# Patient Record
Sex: Female | Born: 1975 | Race: Black or African American | Hispanic: No | Smoking: Former smoker
Health system: Southern US, Community
[De-identification: ages and names within clinical notes are randomized; demographics above are authoritative.]

## PROBLEM LIST (undated history)

## (undated) DIAGNOSIS — G5750 Tarsal tunnel syndrome, unspecified lower limb: Secondary | ICD-10-CM

## (undated) DIAGNOSIS — I10 Essential (primary) hypertension: Secondary | ICD-10-CM

## (undated) DIAGNOSIS — K8000 Calculus of gallbladder with acute cholecystitis without obstruction: Secondary | ICD-10-CM

## (undated) DIAGNOSIS — G5752 Tarsal tunnel syndrome, left lower limb: Secondary | ICD-10-CM

## (undated) DIAGNOSIS — Z720 Tobacco use: Secondary | ICD-10-CM

## (undated) DIAGNOSIS — G43109 Migraine with aura, not intractable, without status migrainosus: Secondary | ICD-10-CM

## (undated) HISTORY — DX: Migraine with aura, not intractable, without status migrainosus: G43.109

## (undated) HISTORY — PX: TUBAL LIGATION: SHX77

## (undated) HISTORY — PX: FOOT SURGERY: SHX648

## (undated) HISTORY — DX: Tarsal tunnel syndrome, left lower limb: G57.52

## (undated) HISTORY — DX: Tobacco use: Z72.0

## (undated) HISTORY — DX: Calculus of gallbladder with acute cholecystitis without obstruction: K80.00

---

## 2006-03-16 ENCOUNTER — Emergency Department: Payer: Self-pay | Admitting: Unknown Physician Specialty

## 2006-05-12 ENCOUNTER — Ambulatory Visit: Payer: Self-pay | Admitting: Podiatry

## 2006-06-05 ENCOUNTER — Emergency Department: Payer: Self-pay | Admitting: Emergency Medicine

## 2006-06-06 ENCOUNTER — Emergency Department: Payer: Self-pay | Admitting: Emergency Medicine

## 2006-06-14 ENCOUNTER — Encounter: Payer: Self-pay | Admitting: Unknown Physician Specialty

## 2006-12-30 ENCOUNTER — Emergency Department: Payer: Self-pay | Admitting: Emergency Medicine

## 2007-10-21 ENCOUNTER — Emergency Department: Payer: Self-pay | Admitting: Emergency Medicine

## 2007-11-03 ENCOUNTER — Emergency Department: Payer: Self-pay | Admitting: Emergency Medicine

## 2008-03-12 ENCOUNTER — Emergency Department: Payer: Self-pay | Admitting: Internal Medicine

## 2008-03-13 ENCOUNTER — Emergency Department: Payer: Self-pay | Admitting: Emergency Medicine

## 2008-05-11 ENCOUNTER — Ambulatory Visit: Payer: Self-pay | Admitting: Family Medicine

## 2008-08-24 ENCOUNTER — Emergency Department: Payer: Self-pay | Admitting: Emergency Medicine

## 2008-08-27 ENCOUNTER — Emergency Department: Payer: Self-pay | Admitting: Emergency Medicine

## 2008-09-21 ENCOUNTER — Emergency Department: Payer: Self-pay | Admitting: Emergency Medicine

## 2009-05-16 ENCOUNTER — Emergency Department: Payer: Self-pay | Admitting: Emergency Medicine

## 2010-02-11 ENCOUNTER — Emergency Department: Payer: Self-pay | Admitting: Emergency Medicine

## 2011-03-07 ENCOUNTER — Emergency Department: Payer: Self-pay | Admitting: Emergency Medicine

## 2011-03-07 LAB — URINALYSIS, COMPLETE
Bilirubin,UR: NEGATIVE
Leukocyte Esterase: NEGATIVE
Nitrite: NEGATIVE
Ph: 5 (ref 4.5–8.0)
Protein: NEGATIVE
RBC,UR: 5 /HPF (ref 0–5)

## 2012-08-30 ENCOUNTER — Emergency Department: Payer: Self-pay | Admitting: Emergency Medicine

## 2012-12-21 ENCOUNTER — Emergency Department: Payer: Self-pay | Admitting: Emergency Medicine

## 2013-01-04 ENCOUNTER — Ambulatory Visit: Payer: Self-pay | Admitting: Family Medicine

## 2013-08-12 ENCOUNTER — Emergency Department: Payer: Self-pay | Admitting: Emergency Medicine

## 2013-08-12 LAB — CBC WITH DIFFERENTIAL/PLATELET
BASOS ABS: 0 10*3/uL (ref 0.0–0.1)
Basophil %: 0.8 %
EOS PCT: 2.1 %
Eosinophil #: 0.1 10*3/uL (ref 0.0–0.7)
HCT: 39.3 % (ref 35.0–47.0)
HGB: 12.6 g/dL (ref 12.0–16.0)
LYMPHS ABS: 2.4 10*3/uL (ref 1.0–3.6)
LYMPHS PCT: 44.7 %
MCH: 27.6 pg (ref 26.0–34.0)
MCHC: 32.1 g/dL (ref 32.0–36.0)
MCV: 86 fL (ref 80–100)
Monocyte #: 0.5 x10 3/mm (ref 0.2–0.9)
Monocyte %: 8.4 %
NEUTROS PCT: 44 %
Neutrophil #: 2.4 10*3/uL (ref 1.4–6.5)
PLATELETS: 219 10*3/uL (ref 150–440)
RBC: 4.56 10*6/uL (ref 3.80–5.20)
RDW: 14.9 % — ABNORMAL HIGH (ref 11.5–14.5)
WBC: 5.4 10*3/uL (ref 3.6–11.0)

## 2013-08-12 LAB — COMPREHENSIVE METABOLIC PANEL
ALK PHOS: 61 U/L
ANION GAP: 6 — AB (ref 7–16)
Albumin: 3.5 g/dL (ref 3.4–5.0)
BUN: 12 mg/dL (ref 7–18)
Bilirubin,Total: 0.2 mg/dL (ref 0.2–1.0)
CALCIUM: 8.1 mg/dL — AB (ref 8.5–10.1)
CREATININE: 0.96 mg/dL (ref 0.60–1.30)
Chloride: 110 mmol/L — ABNORMAL HIGH (ref 98–107)
Co2: 25 mmol/L (ref 21–32)
GLUCOSE: 115 mg/dL — AB (ref 65–99)
Osmolality: 282 (ref 275–301)
Potassium: 3.1 mmol/L — ABNORMAL LOW (ref 3.5–5.1)
SGOT(AST): 12 U/L — ABNORMAL LOW (ref 15–37)
SGPT (ALT): 16 U/L (ref 12–78)
Sodium: 141 mmol/L (ref 136–145)
TOTAL PROTEIN: 6.7 g/dL (ref 6.4–8.2)

## 2013-08-12 LAB — URINALYSIS, COMPLETE
BLOOD: NEGATIVE
Bilirubin,UR: NEGATIVE
Glucose,UR: NEGATIVE mg/dL (ref 0–75)
Ketone: NEGATIVE
Leukocyte Esterase: NEGATIVE
Nitrite: NEGATIVE
PH: 5 (ref 4.5–8.0)
Protein: NEGATIVE
RBC,UR: 1 /HPF (ref 0–5)
Specific Gravity: 1.03 (ref 1.003–1.030)
Squamous Epithelial: 2
WBC UR: 1 /HPF (ref 0–5)

## 2013-09-09 ENCOUNTER — Emergency Department: Payer: Self-pay | Admitting: Emergency Medicine

## 2013-12-05 ENCOUNTER — Ambulatory Visit: Payer: Medicaid Other | Admitting: Podiatry

## 2014-07-02 ENCOUNTER — Encounter: Payer: Self-pay | Admitting: Emergency Medicine

## 2014-07-02 ENCOUNTER — Emergency Department
Admission: EM | Admit: 2014-07-02 | Discharge: 2014-07-02 | Disposition: A | Discharge status: Home / Self Care | Payer: Medicaid Other | Attending: Emergency Medicine | Admitting: Emergency Medicine

## 2014-07-02 DIAGNOSIS — J029 Acute pharyngitis, unspecified: Secondary | ICD-10-CM | POA: Insufficient documentation

## 2014-07-02 DIAGNOSIS — I10 Essential (primary) hypertension: Secondary | ICD-10-CM | POA: Diagnosis not present

## 2014-07-02 DIAGNOSIS — Z720 Tobacco use: Secondary | ICD-10-CM | POA: Insufficient documentation

## 2014-07-02 HISTORY — DX: Tarsal tunnel syndrome, unspecified lower limb: G57.50

## 2014-07-02 HISTORY — DX: Essential (primary) hypertension: I10

## 2014-07-02 MED ORDER — HYDROCODONE-ACETAMINOPHEN 5-325 MG PO TABS
ORAL_TABLET | ORAL | Status: AC
  Administered 2014-07-02: 1 via ORAL
  Filled 2014-07-02: qty 1

## 2014-07-02 MED ORDER — LIDOCAINE VISCOUS 2 % MT SOLN
OROMUCOSAL | Status: AC
  Administered 2014-07-02: 10 mL via OROMUCOSAL
  Filled 2014-07-02: qty 15

## 2014-07-02 MED ORDER — DEXAMETHASONE SODIUM PHOSPHATE 10 MG/ML IJ SOLN
10.0000 mg | Freq: Once | INTRAMUSCULAR | Status: AC
  Administered 2014-07-02: 10 mg via INTRAMUSCULAR

## 2014-07-02 MED ORDER — LORATADINE 10 MG PO TABS: 10.0000 mg | ORAL_TABLET | Freq: Every day | ORAL | Status: DC

## 2014-07-02 MED ORDER — DEXAMETHASONE SODIUM PHOSPHATE 10 MG/ML IJ SOLN
INTRAMUSCULAR | Status: AC
  Administered 2014-07-02: 10 mg via INTRAMUSCULAR
  Filled 2014-07-02: qty 1

## 2014-07-02 MED ORDER — HYDROCODONE-ACETAMINOPHEN 5-325 MG PO TABS: 1.0000 | ORAL_TABLET | Freq: Four times a day (QID) | ORAL | Status: DC | PRN

## 2014-07-02 MED ORDER — PENICILLIN G BENZATHINE 1200000 UNIT/2ML IM SUSP
1.2000 10*6.[IU] | Freq: Once | INTRAMUSCULAR | Status: AC
  Administered 2014-07-02: 1.2 10*6.[IU] via INTRAMUSCULAR
  Filled 2014-07-02: qty 2

## 2014-07-02 MED ORDER — LIDOCAINE VISCOUS 2 % MT SOLN
10.0000 mL | Freq: Once | OROMUCOSAL | Status: AC
  Administered 2014-07-02: 10 mL via OROMUCOSAL

## 2014-07-02 MED ORDER — HYDROCODONE-ACETAMINOPHEN 5-325 MG PO TABS
1.0000 | ORAL_TABLET | Freq: Once | ORAL | Status: AC
  Administered 2014-07-02: 1 via ORAL

## 2014-07-02 NOTE — ED Notes (Signed)
Patient discharge and follow up information reviewed with patient by ED nursing staff and patient given the opportunity to ask questions pertaining to ED visit and discharge plan of care. Patient advised that should symptoms not continue to improve, resolve entirely, or should new symptoms develop then a follow up visit with their PCP or a return visit to the ED may be warranted. Patient verbalized consent and understanding of discharge plan of care including potential need for further evaluation. Patient being discharged in stable condition per attending ED PA on duty.  

## 2014-07-02 NOTE — ED Provider Notes (Signed)
CSN: [id number]     Arrival date & time 07/02/14  1808 History   First MD Initiated Contact with Patient 07/02/14 1844     Chief Complaint  Patient presents with  . Sore Throat     (Consider location/radiation/quality/duration/timing/severity/associated sxs/prior Treatment) HPI  Patient with 2 day history worsening sore throat burning subjective fever chills rates pain as approximately an 8 out of 10 says it's hard to swallow she is controlling her secretions she is not complaining of any wheezing stridor nothing seems to make this better at this time and is here today for further evaluation notes no other symptoms of note  Past Medical History  Diagnosis Date  . Hypertension   . Tarsal tunnel syndrome    Past Surgical History  Procedure Laterality Date  . Foot surgery Left    No family history on file. History  Substance Use Topics  . Smoking status: Current Every Day Smoker -- 1.00 packs/day    Types: Cigarettes  . Smokeless tobacco: Not on file  . Alcohol Use: No   OB History    No data available     Review of Systems  Constitutional: Negative.   HENT: Positive for sore throat. Negative for congestion, ear discharge and ear pain.   Eyes: Negative.   Respiratory: Negative.  Negative for stridor.   Cardiovascular: Negative.   Skin: Negative.   Neurological: Negative for headaches.  All other systems reviewed and are negative.  Review of Systems  Constitutional: Negative.   HENT: Positive for sore throat. Negative for congestion, ear discharge and ear pain.   Eyes: Negative.   Respiratory: Negative.  Negative for stridor.   Cardiovascular: Negative.   Skin: Negative.   Neurological: Negative for headaches.  All other systems reviewed and are negative.    Allergies  Ibuprofen  Home Medications   Prior to Admission medications   Medication Sig Start Date End Date Taking? Authorizing Provider  HYDROcodone-acetaminophen (NORCO) 5-325 MG per tablet Take 1  tablet by mouth every 6 (six) hours as needed for moderate pain or severe pain. 07/02/14   Malvina Schadler William C Naela Nodal, PA-C  loratadine (CLARITIN) 10 MG tablet Take 1 tablet (10 mg total) by mouth daily. 07/02/14 07/02/15  Eleyna Brugh William C Aymara Sassi, PA-C   BP 148/95 mmHg  Pulse 86  Temp(Src) 98.7 F (37.1 C)  Resp 18  Ht 5\' 5"  (1.651 m)  Wt 187 lb (84.823 kg)  BMI 31.12 kg/m2  SpO2 98%  LMP 06/07/2014 (Exact Date) Physical Exam African-American female appearing stated age well-developed well-nourished and in no acute distress her vitals were reviewed per the nursing note Head ears eyes nose next throat exam reveals +1 tonsils with exudate anterior cervical adenopathy airway is open and intact Cardiovascular regular rate and rhythm no murmurs rubs gallops pulmonary lungs are clear to auscultation bilaterally skin appears free of rash or disease Neuro exam is nonfocal cranial nerves II through XII grossly intact Psych patient is very pleasant affect and appropriate ED Course  Procedures (including critical care time)   MDM  The patient were +1 tonsils exudate anterior cervical adenopathy who is having worsening sore throat over 2 days pain with swallowing was given a shot of Decadron some viscous lidocaine hydrocodone for pain and she opted for a shot of Bicillin LA here she is to follow-up with ENT if symptoms persist return here for any acute concerns or worsening symptoms she was: Controlling her secretions and no difficulty breathing or stridor Final diagnoses:  Pharyngitis        Deryck Hippler Rosalyn GessWilliam C Mela Perham, PA-C 07/02/14 2005  Sharyn CreamerMark Quale, MD 07/14/14 [phone]

## 2014-07-02 NOTE — ED Notes (Signed)
Patient presents to ED with c/o sore throat x2 days; reports pain worse today and throat feels more swollen.

## 2015-01-02 ENCOUNTER — Encounter: Payer: Self-pay | Admitting: Urgent Care

## 2015-01-02 DIAGNOSIS — I10 Essential (primary) hypertension: Secondary | ICD-10-CM | POA: Insufficient documentation

## 2015-01-02 DIAGNOSIS — F17210 Nicotine dependence, cigarettes, uncomplicated: Secondary | ICD-10-CM | POA: Insufficient documentation

## 2015-01-02 DIAGNOSIS — R51 Headache: Secondary | ICD-10-CM | POA: Insufficient documentation

## 2015-01-02 DIAGNOSIS — Z79899 Other long term (current) drug therapy: Secondary | ICD-10-CM | POA: Insufficient documentation

## 2015-01-02 DIAGNOSIS — Z791 Long term (current) use of non-steroidal anti-inflammatories (NSAID): Secondary | ICD-10-CM | POA: Insufficient documentation

## 2015-01-02 MED ORDER — SODIUM CHLORIDE 0.9 % IV BOLUS (SEPSIS)
1000.0000 mL | Freq: Once | INTRAVENOUS | Status: AC
  Administered 2015-01-02: 1000 mL via INTRAVENOUS

## 2015-01-02 MED ORDER — HYDROCHLOROTHIAZIDE 25 MG PO TABS
25.0000 mg | ORAL_TABLET | Freq: Every day | ORAL | Status: DC
  Administered 2015-01-02: 25 mg via ORAL
  Filled 2015-01-02: qty 1

## 2015-01-02 MED ORDER — PROCHLORPERAZINE EDISYLATE 5 MG/ML IJ SOLN
10.0000 mg | Freq: Four times a day (QID) | INTRAMUSCULAR | Status: DC | PRN
  Administered 2015-01-02: 10 mg via INTRAVENOUS
  Filled 2015-01-02: qty 2

## 2015-01-02 MED ORDER — PROPRANOLOL HCL 80 MG PO TABS
80.0000 mg | ORAL_TABLET | Freq: Once | ORAL | Status: AC
  Administered 2015-01-03: 80 mg via ORAL
  Filled 2015-01-02 (×2): qty 1

## 2015-01-02 NOTE — ED Notes (Signed)
Pharmacy called for Inderal 80mg  dose; spoke with pharmacy tech who advised that medication would be sent to ED. Asked float nurse and charge nurse to follow up on medication and administer when it arrives.

## 2015-01-02 NOTE — ED Notes (Signed)
Spoke with Derrill KayGoodman, MD regarding presenting c/o and triage assessment. MD with [facility] for: PIV, 1L NS, Compazine 10mg  IVP, HCTZ 25mg  PO, and Propranolol 80mg  PO. Orders to be entered and carried by this RN.

## 2015-01-02 NOTE — ED Notes (Signed)
Patient presents with c/o a generalized headache. Patient reports that she has a headache "20 days out of the month" due to HTN. Patient has been out of BP meds x 2 months. Denies visual changes and neck pain.

## 2015-01-03 ENCOUNTER — Emergency Department
Admission: EM | Admit: 2015-01-03 | Discharge: 2015-01-03 | Disposition: A | Discharge status: Home / Self Care | Payer: Medicaid Other | Attending: Emergency Medicine | Admitting: Emergency Medicine

## 2015-01-03 DIAGNOSIS — I10 Essential (primary) hypertension: Secondary | ICD-10-CM

## 2015-01-03 MED ORDER — BISOPROLOL-HYDROCHLOROTHIAZIDE 10-6.25 MG PO TABS: 1.0000 | ORAL_TABLET | Freq: Every day | ORAL | Status: DC

## 2015-01-03 MED ORDER — AMITRIPTYLINE HCL 25 MG PO TABS
25.0000 mg | ORAL_TABLET | Freq: Every day | ORAL | Status: DC
  Administered 2015-01-03: 25 mg via ORAL
  Filled 2015-01-03 (×2): qty 1

## 2015-01-03 MED ORDER — AMITRIPTYLINE HCL 50 MG PO TABS: 50.0000 mg | ORAL_TABLET | Freq: Every day | ORAL | Status: DC

## 2015-01-03 NOTE — ED Provider Notes (Signed)
[facility] Emergency Department Provider Note  ____________________________________________  Time seen: 1:15 AM  I have reviewed the triage vital signs and the nursing notes.   HISTORY  Chief Complaint Hypertension and Headache     HPI Brandi Swanson is a 39 y.o. female presents with history of migraines and hypertension. Patient states tonight she started experiencing a migraine headache consistent with previous migraines. In addition patient stated that she ran out of her hypertensive medication approximately 20 days ago and noted that she was markedly hypertensive today. Patient also states that she has ran out of her amitriptyline for migraine prophylaxis as well. This is secondary to loss of medical insurance.Patient denied any weakness numbness or gait instability noted visual changes. Patient states that her headache is much improved since receiving IV fluids and HCTZ on arrival. Current pain score 5 out of 10     Past Medical History  Diagnosis Date  . Hypertension   . Tarsal tunnel syndrome     There are no active problems to display for this patient.   Past Surgical History  Procedure Laterality Date  . Foot surgery Left     Current Outpatient Rx  Name  Route  Sig  Dispense  Refill  . amitriptyline (ELAVIL) 50 MG tablet   Oral   Take 50 mg by mouth at bedtime.         . diclofenac sodium (VOLTAREN) 1 % GEL   Topical   Apply 1 application topically 2 (two) times daily.         . hydrochlorothiazide (HYDRODIURIL) 25 MG tablet   Oral   Take 25 mg by mouth daily.         Marland Kitchen. HYDROcodone-acetaminophen (NORCO) 5-325 MG per tablet   Oral   Take 1 tablet by mouth every 6 (six) hours as needed for moderate pain or severe pain.   10 tablet   0   . loratadine (CLARITIN) 10 MG tablet   Oral   Take 1 tablet (10 mg total) by mouth daily.   30 tablet   2   . metoCLOPramide (REGLAN) 10 MG tablet   Oral   Take 10 mg by mouth  every 6 (six) hours as needed for nausea.         Marland Kitchen. omeprazole (PRILOSEC) 40 MG capsule   Oral   Take 40 mg by mouth daily.         . pregabalin (LYRICA) 100 MG capsule   Oral   Take 100 mg by mouth 3 (three) times daily.         . propranolol (INDERAL) 80 MG tablet   Oral   Take 80 mg by mouth 2 (two) times daily.         . SUMAtriptan (IMITREX) 50 MG tablet   Oral   Take 50 mg by mouth every 2 (two) hours as needed for migraine. May repeat in 2 hours if headache persists or recurs.           Allergies Ibuprofen  No family history on file.  Social History Social History  Substance Use Topics  . Smoking status: Current Every Day Smoker -- 1.00 packs/day    Types: Cigarettes  . Smokeless tobacco: None  . Alcohol Use: No    Review of Systems  Constitutional: Negative for fever. Eyes: Negative for visual changes. ENT: Negative for sore throat. Cardiovascular: Negative for chest pain. Respiratory: Negative for shortness of breath. Gastrointestinal: Negative for abdominal pain, vomiting  and diarrhea. Genitourinary: Negative for dysuria. Musculoskeletal: Negative for back pain. Skin: Negative for rash. Neurological: Positive for headaches.   10-point ROS otherwise negative.  ____________________________________________   PHYSICAL EXAM:  VITAL SIGNS: ED Triage Vitals  Enc Vitals Group     BP 01/02/15 2011 197/117 mmHg     Pulse Rate 01/02/15 2011 80     Resp 01/02/15 2011 16     Temp 01/02/15 2011 97.9 F (36.6 C)     Temp Source 01/02/15 2011 Oral     SpO2 01/02/15 2011 100 %     Weight 01/02/15 2011 190 lb (86.183 kg)     Height 01/02/15 2011  (1.651 m)     Head Cir --      Peak Flow --      Pain Score 01/02/15 2011 10     Pain Loc --      Pain Edu? --      Excl. in GC? --      Constitutional: Alert and oriented. Well appearing and in no distress. Eyes: Conjunctivae are normal. PERRL. Normal extraocular movements. ENT   Head:  Normocephalic and atraumatic.   Nose: No congestion/rhinnorhea.   Mouth/Throat: Mucous membranes are moist.   Neck: No stridor. Hematological/Lymphatic/Immunilogical: No cervical lymphadenopathy. Cardiovascular: Normal rate, regular rhythm. Normal and symmetric distal pulses are present in all extremities. No murmurs, rubs, or gallops. Respiratory: Normal respiratory effort without tachypnea nor retractions. Breath sounds are clear and equal bilaterally. No wheezes/rales/rhonchi. Gastrointestinal: Soft and nontender. No distention. There is no CVA tenderness. Genitourinary: deferred Musculoskeletal: Nontender with normal range of motion in all extremities. No joint effusions.  No lower extremity tenderness nor edema. Neurologic:  Normal speech and language. No gross focal neurologic deficits are appreciated. Speech is normal.  Skin:  Skin is warm, dry and intact. No rash noted. Psychiatric: Mood and affect are normal. Speech and behavior are normal. Patient exhibits appropriate insight and judgment.  ____________________________________________      INITIAL IMPRESSION / ASSESSMENT AND PLAN / ED COURSE  Pertinent labs & imaging results that were available during my care of the patient were reviewed by me and considered in my medical decision making (see chart for details).  Patient received HCTZ, Propanolol and Amitriptyline be prescribed the same for home ____________________________________________   FINAL CLINICAL IMPRESSION(S) / ED DIAGNOSES  Final diagnoses:  Essential hypertension      Darci Current, MD 01/03/15 [phone]

## 2015-01-03 NOTE — Discharge Instructions (Signed)
Hypertension Hypertension, commonly called high blood pressure, is when the force of blood pumping through your arteries is too strong. Your arteries are the blood vessels that carry blood from your heart throughout your body. A blood pressure reading consists of a higher number over a lower number, such as 110/72. The higher number (systolic) is the pressure inside your arteries when your heart pumps. The lower number (diastolic) is the pressure inside your arteries when your heart relaxes. Ideally you want your blood pressure below 120/80. Hypertension forces your heart to work harder to pump blood. Your arteries may become narrow or stiff. Having untreated or uncontrolled hypertension can cause heart attack, stroke, kidney disease, and other problems. RISK FACTORS Some risk factors for high blood pressure are controllable. Others are not.  Risk factors you cannot control include:   Race. You may be at higher risk if you are African American.  Age. Risk increases with age.  Gender. Men are at higher risk than women before age 45 years. After age 65, women are at higher risk than men. Risk factors you can control include:  Not getting enough exercise or physical activity.  Being overweight.  Getting too much fat, sugar, calories, or salt in your diet.  Drinking too much alcohol. SIGNS AND SYMPTOMS Hypertension does not usually cause signs or symptoms. Extremely high blood pressure (hypertensive crisis) may cause headache, anxiety, shortness of breath, and nosebleed. DIAGNOSIS To check if you have hypertension, your health care provider will measure your blood pressure while you are seated, with your arm held at the level of your heart. It should be measured at least twice using the same arm. Certain conditions can cause a difference in blood pressure between your right and left arms. A blood pressure reading that is higher than normal on one occasion does not mean that you need treatment. If  it is not clear whether you have high blood pressure, you may be asked to return on a different day to have your blood pressure checked again. Or, you may be asked to monitor your blood pressure at home for 1 or more weeks. TREATMENT Treating high blood pressure includes making lifestyle changes and possibly taking medicine. Living a healthy lifestyle can help lower high blood pressure. You may need to change some of your habits. Lifestyle changes may include:  Following the DASH diet. This diet is high in fruits, vegetables, and whole grains. It is low in salt, red meat, and added sugars.  Keep your sodium intake below 2,300 mg per day.  Getting at least 30-45 minutes of aerobic exercise at least 4 times per week.  Losing weight if necessary.  Not smoking.  Limiting alcoholic beverages.  Learning ways to reduce stress. Your health care provider may prescribe medicine if lifestyle changes are not enough to get your blood pressure under control, and if one of the following is true:  You are 18-59 years of age and your systolic blood pressure is above 140.  You are 60 years of age or older, and your systolic blood pressure is above 150.  Your diastolic blood pressure is above 90.  You have diabetes, and your systolic blood pressure is over 140 or your diastolic blood pressure is over 90.  You have kidney disease and your blood pressure is above 140/90.  You have heart disease and your blood pressure is above 140/90. Your personal target blood pressure may vary depending on your medical conditions, your age, and other factors. HOME CARE INSTRUCTIONS    Have your blood pressure rechecked as directed by your health care provider.   Take medicines only as directed by your health care provider. Follow the directions carefully. Blood pressure medicines must be taken as prescribed. The medicine does not work as well when you skip doses. Skipping doses also puts you at risk for  problems.  Do not smoke.   Monitor your blood pressure at home as directed by your health care provider. SEEK MEDICAL CARE IF:   You think you are having a reaction to medicines taken.  You have recurrent headaches or feel dizzy.  You have swelling in your ankles.  You have trouble with your vision. SEEK IMMEDIATE MEDICAL CARE IF:  You develop a severe headache or confusion.  You have unusual weakness, numbness, or feel faint.  You have severe chest or abdominal pain.  You vomit repeatedly.  You have trouble breathing. MAKE SURE YOU:   Understand these instructions.  Will watch your condition.  Will get help right away if you are not doing well or get worse.   This information is not intended to replace advice given to you by your health care provider. Make sure you discuss any questions you have with your health care provider.   Document Released: 02/02/2005 Document Revised: 06/19/2014 Document Reviewed: 11/25/2012 Elsevier Interactive Patient Education 2016 [employer]  

## 2015-06-24 ENCOUNTER — Emergency Department: Payer: Self-pay

## 2015-06-24 ENCOUNTER — Encounter: Payer: Self-pay | Admitting: Emergency Medicine

## 2015-06-24 ENCOUNTER — Emergency Department
Admission: EM | Admit: 2015-06-24 | Discharge: 2015-06-24 | Disposition: A | Discharge status: Home / Self Care | Payer: Self-pay | Attending: Emergency Medicine | Admitting: Emergency Medicine

## 2015-06-24 DIAGNOSIS — F17210 Nicotine dependence, cigarettes, uncomplicated: Secondary | ICD-10-CM | POA: Insufficient documentation

## 2015-06-24 DIAGNOSIS — Z79899 Other long term (current) drug therapy: Secondary | ICD-10-CM | POA: Insufficient documentation

## 2015-06-24 DIAGNOSIS — I10 Essential (primary) hypertension: Secondary | ICD-10-CM | POA: Insufficient documentation

## 2015-06-24 DIAGNOSIS — J9801 Acute bronchospasm: Secondary | ICD-10-CM | POA: Insufficient documentation

## 2015-06-24 LAB — BASIC METABOLIC PANEL
ANION GAP: 8 (ref 5–15)
BUN: 9 mg/dL (ref 6–20)
CO2: 25 mmol/L (ref 22–32)
Calcium: 8.9 mg/dL (ref 8.9–10.3)
Chloride: 110 mmol/L (ref 101–111)
Creatinine, Ser: 0.85 mg/dL (ref 0.44–1.00)
GFR calc Af Amer: >60 mL/min (ref >60)
GLUCOSE: 91 mg/dL (ref 65–99)
POTASSIUM: 3.5 mmol/L (ref 3.5–5.1)
Sodium: 143 mmol/L (ref 135–145)

## 2015-06-24 LAB — URINALYSIS COMPLETE WITH MICROSCOPIC (ARMC ONLY)
Bilirubin Urine: NEGATIVE
Glucose, UA: NEGATIVE mg/dL
Hgb urine dipstick: NEGATIVE
KETONES UR: NEGATIVE mg/dL
Leukocytes, UA: NEGATIVE
Nitrite: NEGATIVE
PROTEIN: NEGATIVE mg/dL
RBC / HPF: NONE SEEN RBC/hpf (ref 0–5)
Specific Gravity, Urine: 1.025 (ref 1.005–1.030)
WBC, UA: NONE SEEN WBC/hpf (ref 0–5)
pH: 6 (ref 5.0–8.0)

## 2015-06-24 LAB — CBC
HCT: 40.2 % (ref 35.0–47.0)
Hemoglobin: 13.3 g/dL (ref 12.0–16.0)
MCH: 27.4 pg (ref 26.0–34.0)
MCHC: 33 g/dL (ref 32.0–36.0)
MCV: 83 fL (ref 80.0–100.0)
PLATELETS: 212 10*3/uL (ref 150–440)
RBC: 4.84 MIL/uL (ref 3.80–5.20)
RDW: 16.3 % — ABNORMAL HIGH (ref 11.5–14.5)
WBC: 5.8 10*3/uL (ref 3.6–11.0)

## 2015-06-24 MED ORDER — IOPAMIDOL (ISOVUE-370) INJECTION 76%
75.0000 mL | Freq: Once | INTRAVENOUS | Status: AC | PRN
  Administered 2015-06-24: 75 mL via INTRAVENOUS

## 2015-06-24 MED ORDER — BISOPROLOL-HYDROCHLOROTHIAZIDE 10-6.25 MG PO TABS
1.0000 | ORAL_TABLET | Freq: Every day | ORAL | Status: DC
  Administered 2015-06-24: 1 via ORAL
  Filled 2015-06-24: qty 1

## 2015-06-24 MED ORDER — PREDNISONE 20 MG PO TABS
50.0000 mg | ORAL_TABLET | Freq: Once | ORAL | Status: AC
  Administered 2015-06-24: 50 mg via ORAL
  Filled 2015-06-24: qty 2

## 2015-06-24 MED ORDER — BISOPROLOL-HYDROCHLOROTHIAZIDE 10-6.25 MG PO TABS: 1.0000 | ORAL_TABLET | Freq: Every day | ORAL | Status: DC

## 2015-06-24 MED ORDER — PREDNISONE 10 MG PO TABS
ORAL_TABLET | ORAL | Status: AC
Start: 2015-06-24 — End: 2015-06-24
  Administered 2015-06-24: 10 mg
  Filled 2015-06-24: qty 1

## 2015-06-24 MED ORDER — ACETAMINOPHEN 325 MG PO TABS
ORAL_TABLET | ORAL | Status: AC
  Administered 2015-06-24: 650 mg via ORAL
  Filled 2015-06-24: qty 2

## 2015-06-24 MED ORDER — IPRATROPIUM-ALBUTEROL 0.5-2.5 (3) MG/3ML IN SOLN
3.0000 mL | Freq: Once | RESPIRATORY_TRACT | Status: AC
  Administered 2015-06-24: 3 mL via RESPIRATORY_TRACT
  Filled 2015-06-24: qty 3

## 2015-06-24 MED ORDER — ALBUTEROL SULFATE HFA 108 (90 BASE) MCG/ACT IN AERS: 2.0000 | INHALATION_SPRAY | Freq: Four times a day (QID) | RESPIRATORY_TRACT | Status: DC | PRN

## 2015-06-24 MED ORDER — PREDNISONE 10 MG PO TABS: 50.0000 mg | ORAL_TABLET | Freq: Every day | ORAL | Status: DC

## 2015-06-24 MED ORDER — ACETAMINOPHEN 325 MG PO TABS
650.0000 mg | ORAL_TABLET | Freq: Once | ORAL | Status: AC
  Administered 2015-06-24: 650 mg via ORAL

## 2015-06-24 MED ORDER — IBUPROFEN 600 MG PO TABS
ORAL_TABLET | ORAL | Status: AC
  Filled 2015-06-24: qty 1

## 2015-06-24 NOTE — Care Management Note (Signed)
Case Management Note  Patient Details  Name: Brandi Swanson MRN: [id number] Date of Birth: September 25, 1975  Subjective/Objective:     Patient  provided with application packet for Medication Management.  She has stated she requires help with her regular medications.             Action/Plan:   Expected Discharge Date:                  Expected Discharge Plan:     In-House Referral:     Discharge planning Services     Post Acute Care Choice:    Choice offered to:     DME Arranged:    DME Agency:     HH Arranged:    HH Agency:     Status of Service:     Medicare Important Message Given:    Date Medicare IM Given:    Medicare IM give by:    Date Additional Medicare IM Given:    Additional Medicare Important Message give by:     If discussed at Long Length of Stay Meetings, dates discussed:    Additional Comments:  Brandi BueCheryl Aissata Wilmore, RN 06/24/2015, 1:41 PM

## 2015-06-24 NOTE — ED Provider Notes (Signed)
[facility] Emergency Department Provider Note   ____________________________________________  Time seen: Approximately 11:20 AM I have reviewed the triage vital signs and the triage nursing note.  HISTORY  Chief Complaint Cough; Shortness of Breath; and Hypertension   Historian Patient  HPI Brandi Swanson is a 40 y.o. female with history smoking is here with shortness of breath that she knows this morning she's also had some coughing this morning which consisted of clear sputum with a small amount of blood in it. This never happened before. She does not take a blood thinner. She has been out of her blood pressure medications. No fever. No pleuritic chest pain. No chest pain otherwise.  Partial family history, father may have had a blood clot although it sounds like they may be talking about coronary artery disease.  No additional risk factors for DVT. Does complain of mild lower extremity edema at times bilaterally. She complains of occasional calf tenderness bilaterally.  No prior history of wheezing or inhaler use.    Past Medical History  Diagnosis Date  . Hypertension   . Tarsal tunnel syndrome     There are no active problems to display for this patient.   Past Surgical History  Procedure Laterality Date  . Foot surgery Left     Current Outpatient Rx  Name  Route  Sig  Dispense  Refill  . albuterol (PROVENTIL HFA;VENTOLIN HFA) 108 (90 Base) MCG/ACT inhaler   Inhalation   Inhale 2 puffs into the lungs every 6 (six) hours as needed for wheezing or shortness of breath.   1 Inhaler   0   . amitriptyline (ELAVIL) 50 MG tablet   Oral   Take 50 mg by mouth at bedtime.         Marland Kitchen. amitriptyline (ELAVIL) 50 MG tablet   Oral   Take 1 tablet (50 mg total) by mouth at bedtime.   90 tablet   0   . bisoprolol-hydrochlorothiazide (ZIAC) 10-6.25 MG tablet   Oral   Take 1 tablet by mouth daily.   30 tablet   0   . diclofenac sodium  (VOLTAREN) 1 % GEL   Topical   Apply 1 application topically 2 (two) times daily.         . hydrochlorothiazide (HYDRODIURIL) 25 MG tablet   Oral   Take 25 mg by mouth daily.         Marland Kitchen. HYDROcodone-acetaminophen (NORCO) 5-325 MG per tablet   Oral   Take 1 tablet by mouth every 6 (six) hours as needed for moderate pain or severe pain.   10 tablet   0   . loratadine (CLARITIN) 10 MG tablet   Oral   Take 1 tablet (10 mg total) by mouth daily.   30 tablet   2   . metoCLOPramide (REGLAN) 10 MG tablet   Oral   Take 10 mg by mouth every 6 (six) hours as needed for nausea.         Marland Kitchen. omeprazole (PRILOSEC) 40 MG capsule   Oral   Take 40 mg by mouth daily.         . predniSONE (DELTASONE) 10 MG tablet   Oral   Take 5 tablets (50 mg total) by mouth daily.   20 tablet   0   . pregabalin (LYRICA) 100 MG capsule   Oral   Take 100 mg by mouth 3 (three) times daily.         . propranolol (INDERAL)  80 MG tablet   Oral   Take 80 mg by mouth 2 (two) times daily.         . SUMAtriptan (IMITREX) 50 MG tablet   Oral   Take 50 mg by mouth every 2 (two) hours as needed for migraine. May repeat in 2 hours if headache persists or recurs.           Allergies Ibuprofen  No family history on file.  Social History Social History  Substance Use Topics  . Smoking status: Current Every Day Smoker -- 1.00 packs/day    Types: Cigarettes  . Smokeless tobacco: None  . Alcohol Use: No    Review of Systems  Constitutional: Negative for fever. Eyes: Negative for visual changes. ENT: Negative for sore throat. Cardiovascular: Negative for chest pain. Respiratory: Positive for shortness of breath. Gastrointestinal: Negative for abdominal pain, vomiting and diarrhea. Genitourinary: Negative for dysuria. Musculoskeletal: Negative for back pain. Skin: Negative for rash. Neurological: Negative for headache. 10 point Review of Systems otherwise  negative ____________________________________________   PHYSICAL EXAM:  VITAL SIGNS: ED Triage Vitals  Enc Vitals Group     BP 06/24/15 0951 179/98 mmHg     Pulse Rate 06/24/15 0951 74     Resp 06/24/15 0951 18     Temp 06/24/15 0951 98.4 F (36.9 C)     Temp Source 06/24/15 0951 Oral     SpO2 06/24/15 0951 100 %     Weight 06/24/15 0951 198 lb (89.812 kg)     Height 06/24/15 0951  (1.651 m)     Head Cir --      Peak Flow --      Pain Score 06/24/15 0952 7     Pain Loc --      Pain Edu? --      Excl. in GC? --      Constitutional: Alert and oriented. Well appearing and in no distress. HEENT   Head: Normocephalic and atraumatic.      Eyes: Conjunctivae are normal. PERRL. Normal extraocular movements.      Ears:         Nose: No congestion/rhinnorhea.   Mouth/Throat: Mucous membranes are moist.   Neck: No stridor. Cardiovascular/Chest: Normal rate, regular rhythm.  No murmurs, rubs, or gallops. Respiratory: Normal respiratory effort without tachypnea nor retractions. Moderate end expiratory wheezing in all fields. No rhonchi. No rales. Gastrointestinal: Soft. No distention, no guarding, no rebound. Nontender.    Genitourinary/rectal:Deferred Musculoskeletal: Nontender with normal range of motion in all extremities. No joint effusions.  No lower extremity tenderness. Trace edema bilateral lower extremities. Neurologic:  Normal speech and language. No gross or focal neurologic deficits are appreciated. Skin:  Skin is warm, dry and intact. No rash noted. Psychiatric: Mood and affect are normal. Speech and behavior are normal. Patient exhibits appropriate insight and judgment.  ____________________________________________   EKG I, Governor Rooks, MD, the attending physician have personally viewed and interpreted all ECGs.  77 bpm. Normal sinus rhythm. Narrow QRS. Normal axis. Normal ST and T-wave ____________________________________________  LABS (pertinent  positives/negatives)  White blood count 5.8, hemoglobin 13.3 and platelet count 212 Basic metabolic panel without significant abnormality Urinalysis without significant abnormalities  ____________________________________________  RADIOLOGY All Xrays were viewed by me. Imaging interpreted by Radiologist.  Chest two-view: Negative chest __________________________________________  PROCEDURES  Procedure(s) performed: None  Critical Care performed: None  ____________________________________________   ED COURSE / ASSESSMENT AND PLAN  Pertinent labs & imaging results that were available  during my care of the patient were reviewed by me and considered in my medical decision making (see chart for details).   Patient states she is mainly here because of coughing up blood, it sounds like a relatively small amount overall, but she is complaining of shortness of breath which is unusual for her.  Her O2 sat is 100% on room air. She's not complaining of pleuritic chest pain, however given the hemoptysis and shortness of breath complaint, I do think that it is clinically indicated to rule out pulmonary embolism with CT of the chest. I discussed risks versus benefit and in shared decision making we chose to proceed.  On clinical exam she does have a moderate wheezing, and does not report a history of asthma. She is a smoker, and I will treat her with DuoNeb treatment.  In terms or hypertension, I'm going to give her the medication that she has been on previously both here in our department and as prescription.   Patient's CT scan showed no PE. I am going to treat her for bronchitis and bronchospasm with prednisone and albuterol. Patient is breathing much better after breathing treatment.    CONSULTATIONS:   None   Patient / Family / Caregiver informed of clinical course, medical decision-making process, and agree with plan.   I discussed return precautions, follow-up instructions, and  discharged instructions with patient and/or family.   ___________________________________________   FINAL CLINICAL IMPRESSION(S) / ED DIAGNOSES   Final diagnoses:  Essential hypertension  Bronchospasm, acute              Note: This dictation was prepared with Dragon dictation. Any transcriptional errors that result from this process are unintentional   Governor Rooks, MD 06/24/15 [phone]

## 2015-06-24 NOTE — ED Notes (Signed)
Pt here with c/o coughing up blood, shob that began this am, states her sputum is clear with bloody streaks. Pt appears in no distress, states she has been out of her bp medication also. 179/98 in triage.

## 2015-06-24 NOTE — Discharge Instructions (Signed)
You were evaluated shortness of breath and coughing up small amount of blood, and your exam and evaluation are reassuring today in the emergency room. Your found to have wheezing and the pain diagnosed with bronchitis. Your being prescribed your home blood pressure medication, as well as a new inhaler for wheezing called albuterol.  Return to the emergency department for any worsening condition including new or worsening trouble breathing, shortness of breath, fever, confusion or altered mental status, passing out, or any other symptoms concerning to you.   Bronchospasm, Adult A bronchospasm is a spasm or tightening of the airways going into the lungs. During a bronchospasm breathing becomes more difficult because the airways get smaller. When this happens there can be coughing, a whistling sound when breathing (wheezing), and difficulty breathing. Bronchospasm is often associated with asthma, but not all patients who experience a bronchospasm have asthma. CAUSES  A bronchospasm is caused by inflammation or irritation of the airways. The inflammation or irritation may be triggered by:   Allergies (such as to animals, pollen, food, or mold). Allergens that cause bronchospasm may cause wheezing immediately after exposure or many hours later.   Infection. Viral infections are believed to be the most common cause of bronchospasm.   Exercise.   Irritants (such as pollution, cigarette smoke, strong odors, aerosol sprays, and paint fumes).   Weather changes. Winds increase molds and pollens in the air. Rain refreshes the air by washing irritants out. Cold air may cause inflammation.   Stress and emotional upset.  SIGNS AND SYMPTOMS   Wheezing.   Excessive nighttime coughing.   Frequent or severe coughing with a simple cold.   Chest tightness.   Shortness of breath.  DIAGNOSIS  Bronchospasm is usually diagnosed through a history and physical exam. Tests, such as chest X-rays, are  sometimes done to look for other conditions. TREATMENT   Inhaled medicines can be given to open up your airways and help you breathe. The medicines can be given using either an inhaler or a nebulizer machine.  Corticosteroid medicines may be given for severe bronchospasm, usually when it is associated with asthma. HOME CARE INSTRUCTIONS   Always have a plan prepared for seeking medical care. Know when to call your health care provider and local emergency services (911 in the [place] Know where you can access local emergency care.  Only take medicines as directed by your health care provider.  If you were prescribed an inhaler or nebulizer machine, ask your health care provider to explain how to use it correctly. Always use a spacer with your inhaler if you were given one.  It is necessary to remain calm during an attack. Try to relax and breathe more slowly.  Control your home environment in the following ways:   Change your heating and air conditioning filter at least once a month.   Limit your use of fireplaces and wood stoves.  Do not smoke and do not allow smoking in your home.   Avoid exposure to perfumes and fragrances.   Get rid of pests (such as roaches and mice) and their droppings.   Throw away plants if you see mold on them.   Keep your house clean and dust free.   Replace carpet with wood, tile, or vinyl flooring. Carpet can trap dander and dust.   Use allergy-proof pillows, mattress covers, and box spring covers.   Wash bed sheets and blankets every week in hot water and dry them in a dryer.   Use blankets  that are made of polyester or cotton.   Wash hands frequently. SEEK MEDICAL CARE IF:   You have muscle aches.   You have chest pain.   The sputum changes from clear or white to yellow, green, gray, or bloody.   The sputum you cough up gets thicker.   There are problems that may be related to the medicine you are given, such as a rash,  itching, swelling, or trouble breathing.  SEEK IMMEDIATE MEDICAL CARE IF:   You have worsening wheezing and coughing even after taking your prescribed medicines.   You have increased difficulty breathing.   You develop severe chest pain. MAKE SURE YOU:   Understand these instructions.  Will watch your condition.  Will get help right away if you are not doing well or get worse.   This information is not intended to replace advice given to you by your health care provider. Make sure you discuss any questions you have with your health care provider.   Document Released: 02/05/2003 Document Revised: 02/23/2014 Document Reviewed: 07/25/2012 Elsevier Interactive Patient Education 2016 Elsevier Inc. Acute Bronchitis Bronchitis is inflammation of the airways that extend from the windpipe into the lungs (bronchi). The inflammation often causes mucus to develop. This leads to a cough, which is the most common symptom of bronchitis.  In acute bronchitis, the condition usually develops suddenly and goes away over time, usually in a couple weeks. Smoking, allergies, and asthma can make bronchitis worse. Repeated episodes of bronchitis may cause further lung problems.  CAUSES Acute bronchitis is most often caused by the same virus that causes a cold. The virus can spread from person to person (contagious) through coughing, sneezing, and touching contaminated objects. SIGNS AND SYMPTOMS   Cough.   Fever.   Coughing up mucus.   Body aches.   Chest congestion.   Chills.   Shortness of breath.   Sore throat.  DIAGNOSIS  Acute bronchitis is usually diagnosed through a physical exam. Your health care provider will also ask you questions about your medical history. Tests, such as chest X-rays, are sometimes done to rule out other conditions.  TREATMENT  Acute bronchitis usually goes away in a couple weeks. Oftentimes, no medical treatment is necessary. Medicines are sometimes  given for relief of fever or cough. Antibiotic medicines are usually not needed but may be prescribed in certain situations. In some cases, an inhaler may be recommended to help reduce shortness of breath and control the cough. A cool mist vaporizer may also be used to help thin bronchial secretions and make it easier to clear the chest.  HOME CARE INSTRUCTIONS  Get plenty of rest.   Drink enough fluids to keep your urine clear or pale yellow (unless you have a medical condition that requires fluid restriction). Increasing fluids may help thin your respiratory secretions (sputum) and reduce chest congestion, and it will prevent dehydration.   Take medicines only as directed by your health care provider.  If you were prescribed an antibiotic medicine, finish it all even if you start to feel better.  Avoid smoking and secondhand smoke. Exposure to cigarette smoke or irritating chemicals will make bronchitis worse. If you are a smoker, consider using nicotine gum or skin patches to help control withdrawal symptoms. Quitting smoking will help your lungs heal faster.   Reduce the chances of another bout of acute bronchitis by washing your hands frequently, avoiding people with cold symptoms, and trying not to touch your hands to your mouth, nose,  or eyes.   Keep all follow-up visits as directed by your health care provider.  SEEK MEDICAL CARE IF: Your symptoms do not improve after 1 week of treatment.  SEEK IMMEDIATE MEDICAL CARE IF:  You develop an increased fever or chills.   You have chest pain.   You have severe shortness of breath.  You have bloody sputum.   You develop dehydration.  You faint or repeatedly feel like you are going to pass out.  You develop repeated vomiting.  You develop a severe headache. MAKE SURE YOU:   Understand these instructions.  Will watch your condition.  Will get help right away if you are not doing well or get worse.   This information  is not intended to replace advice given to you by your health care provider. Make sure you discuss any questions you have with your health care provider.   Document Released: 03/12/2004 Document Revised: 02/23/2014 Document Reviewed: 07/26/2012 Elsevier Interactive Patient Education [employer]2016 Elsevier Inc.

## 2015-06-24 NOTE — ED Notes (Signed)
Pt to CT

## 2015-06-24 NOTE — ED Notes (Signed)
MD at bedside. 

## 2015-07-04 ENCOUNTER — Ambulatory Visit: Payer: Self-pay | Admitting: Urology

## 2015-07-04 VITALS — BP 136/89 | HR 84 | Resp 16 | Ht 65.0 in | Wt 201.0 lb

## 2015-07-04 DIAGNOSIS — K219 Gastro-esophageal reflux disease without esophagitis: Secondary | ICD-10-CM | POA: Insufficient documentation

## 2015-07-04 DIAGNOSIS — F32A Depression, unspecified: Secondary | ICD-10-CM

## 2015-07-04 DIAGNOSIS — J449 Chronic obstructive pulmonary disease, unspecified: Secondary | ICD-10-CM

## 2015-07-04 DIAGNOSIS — J302 Other seasonal allergic rhinitis: Secondary | ICD-10-CM

## 2015-07-04 DIAGNOSIS — F329 Major depressive disorder, single episode, unspecified: Secondary | ICD-10-CM | POA: Insufficient documentation

## 2015-07-04 DIAGNOSIS — F419 Anxiety disorder, unspecified: Secondary | ICD-10-CM | POA: Insufficient documentation

## 2015-07-04 DIAGNOSIS — I10 Essential (primary) hypertension: Secondary | ICD-10-CM | POA: Insufficient documentation

## 2015-07-04 MED ORDER — AMITRIPTYLINE HCL 50 MG PO TABS: 50.0000 mg | ORAL_TABLET | Freq: Every day | ORAL | Status: DC

## 2015-07-04 MED ORDER — LORATADINE 10 MG PO TABS: 10.0000 mg | ORAL_TABLET | Freq: Every day | ORAL | Status: DC

## 2015-07-04 MED ORDER — PROPRANOLOL HCL 40 MG PO TABS: ORAL_TABLET | ORAL | Status: DC

## 2015-07-04 MED ORDER — OMEPRAZOLE 40 MG PO CPDR: 40.0000 mg | DELAYED_RELEASE_CAPSULE | Freq: Every day | ORAL | Status: DC

## 2015-07-04 MED ORDER — ALBUTEROL SULFATE HFA 108 (90 BASE) MCG/ACT IN AERS: 2.0000 | INHALATION_SPRAY | Freq: Four times a day (QID) | RESPIRATORY_TRACT | Status: DC | PRN

## 2015-07-04 MED ORDER — PROMETHAZINE HCL 25 MG PO TABS: 25.0000 mg | ORAL_TABLET | Freq: Four times a day (QID) | ORAL | Status: DC | PRN

## 2015-07-04 MED ORDER — HYDROCHLOROTHIAZIDE 25 MG PO TABS: 25.0000 mg | ORAL_TABLET | Freq: Every day | ORAL | Status: DC

## 2015-07-04 MED ORDER — GABAPENTIN 300 MG PO CAPS: 300.0000 mg | ORAL_CAPSULE | Freq: Three times a day (TID) | ORAL | Status: DC

## 2015-07-04 MED ORDER — BUDESONIDE-FORMOTEROL FUMARATE 160-4.5 MCG/ACT IN AERO: 2.0000 | INHALATION_SPRAY | Freq: Two times a day (BID) | RESPIRATORY_TRACT | Status: DC

## 2015-07-04 NOTE — Progress Notes (Signed)
  Patient: Brandi Swanson Female    DOB: 1976/01/02   40 y.o.   MRN: [id number] Visit Date: 07/04/2015  Today's Provider: [facility]   Chief Complaint  Patient presents with  . Establish Care  . Hypertension  . COPD  . Gastroesophageal Reflux   Subjective:    HPI   Patient hear to establish care.  Recently seen in the ED for exacerbation of her COPD.    Allergies  Allergen Reactions  . Ibuprofen Swelling   Previous Medications   BISOPROLOL-HYDROCHLOROTHIAZIDE (ZIAC) 10-6.25 MG TABLET    Take 1 tablet by mouth daily.   DICLOFENAC SODIUM (VOLTAREN) 1 % GEL    Apply 1 application topically 2 (two) times daily.   HYDROCODONE-ACETAMINOPHEN (NORCO) 5-325 MG PER TABLET    Take 1 tablet by mouth every 6 (six) hours as needed for moderate pain or severe pain.   METOCLOPRAMIDE (REGLAN) 10 MG TABLET    Take 10 mg by mouth every 6 (six) hours as needed for nausea.   PAROXETINE (PAXIL) 20 MG TABLET    Take 20 mg by mouth daily.   PREDNISONE (DELTASONE) 10 MG TABLET    Take 5 tablets (50 mg total) by mouth daily.   PREGABALIN (LYRICA) 100 MG CAPSULE    Take 100 mg by mouth 3 (three) times daily.   SUMATRIPTAN (IMITREX) 50 MG TABLET    Take 50 mg by mouth every 2 (two) hours as needed for migraine. May repeat in 2 hours if headache persists or recurs.    Review of Systems  Constitutional: Negative.   HENT: Negative.   Eyes: Negative.   Respiratory: Positive for cough and wheezing.   Cardiovascular: Negative.   Gastrointestinal: Negative.   Endocrine: Negative.   Genitourinary: Negative.   Musculoskeletal: Negative.   Allergic/Immunologic: Negative.   Neurological: Negative.   Hematological: Negative.   Psychiatric/Behavioral: Negative.     Social History  Substance Use Topics  . Smoking status: Current Every Day Smoker -- 0.25 packs/day    Types: Cigarettes  . Smokeless tobacco: Never Used  . Alcohol Use: No   Objective:   BP 136/89 mmHg  Pulse 84  Resp 16   Ht 5\' 5"  (1.651 m)  Wt 201 lb (91.173 kg)  BMI 33.45 kg/m2  LMP 06/11/2015 (Exact Date)  Physical Exam  Constitutional: She is oriented to person, place, and time. She appears well-developed and well-nourished.  HENT:  Head: Normocephalic and atraumatic.  Eyes: Conjunctivae and EOM are normal. Pupils are equal, round, and reactive to light.  Cardiovascular: Normal rate and regular rhythm.   Pulmonary/Chest: Effort normal and breath sounds normal.  Abdominal: Soft. Bowel sounds are normal.  Musculoskeletal: Normal range of motion.  Neurological: She is alert and oriented to person, place, and time. She has normal reflexes.  Skin: Skin is warm and dry.  Psychiatric: She has a normal mood and affect. Her behavior is normal. Judgment and thought content normal.        Assessment & Plan:     1. Anxiety  -refer to mental health  2. COPD  -continue albuterol  -add symbocort  -follow up in one month  3. HTN  -good control  -continue meds  -check labs  -follow up in one month  4. Migraines  -see if there is assistance available for Imitrex and Volteran cream         [facility]   [facility]

## 2015-08-01 ENCOUNTER — Other Ambulatory Visit: Payer: Self-pay

## 2015-08-07 ENCOUNTER — Ambulatory Visit: Payer: Self-pay

## 2015-08-07 ENCOUNTER — Encounter: Payer: Self-pay | Admitting: Pharmacist

## 2015-08-08 ENCOUNTER — Ambulatory Visit: Payer: Self-pay | Admitting: Family Medicine

## 2015-08-08 VITALS — BP 127/82 | HR 77 | Ht 65.0 in

## 2015-08-08 DIAGNOSIS — G43109 Migraine with aura, not intractable, without status migrainosus: Secondary | ICD-10-CM

## 2015-08-08 DIAGNOSIS — F32A Depression, unspecified: Secondary | ICD-10-CM

## 2015-08-08 DIAGNOSIS — F419 Anxiety disorder, unspecified: Secondary | ICD-10-CM

## 2015-08-08 DIAGNOSIS — Z720 Tobacco use: Secondary | ICD-10-CM

## 2015-08-08 DIAGNOSIS — F329 Major depressive disorder, single episode, unspecified: Secondary | ICD-10-CM

## 2015-08-08 DIAGNOSIS — M79672 Pain in left foot: Secondary | ICD-10-CM | POA: Insufficient documentation

## 2015-08-08 DIAGNOSIS — G5752 Tarsal tunnel syndrome, left lower limb: Secondary | ICD-10-CM

## 2015-08-08 DIAGNOSIS — L84 Corns and callosities: Secondary | ICD-10-CM

## 2015-08-08 DIAGNOSIS — I10 Essential (primary) hypertension: Secondary | ICD-10-CM

## 2015-08-08 DIAGNOSIS — J449 Chronic obstructive pulmonary disease, unspecified: Secondary | ICD-10-CM

## 2015-08-08 HISTORY — DX: Migraine with aura, not intractable, without status migrainosus: G43.109

## 2015-08-08 HISTORY — DX: Tarsal tunnel syndrome, left lower limb: G57.52

## 2015-08-08 HISTORY — DX: Tobacco use: Z72.0

## 2015-08-08 LAB — GLUCOSE, POCT (MANUAL RESULT ENTRY): POC Glucose: 116 mg/dl — AB (ref 70–99)

## 2015-08-08 MED ORDER — GABAPENTIN 300 MG PO CAPS: 300.0000 mg | ORAL_CAPSULE | Freq: Three times a day (TID) | ORAL | Status: DC

## 2015-08-08 MED ORDER — DICLOFENAC SODIUM 1 % TD GEL: 2.0000 g | Freq: Two times a day (BID) | TRANSDERMAL | Status: DC

## 2015-08-08 MED ORDER — PAROXETINE HCL 20 MG PO TABS: 20.0000 mg | ORAL_TABLET | Freq: Every day | ORAL | Status: DC

## 2015-08-08 NOTE — Patient Instructions (Addendum)
Never use the imitrex if your blood pressure is high (risk of stroke)  Start back on the gabapentin One pill once a day for 3 days, then one pill twice a day for 3 days, then one pill three times a day  We'll have you see the podiatrist ASAP  Start back on the paxil (paroxetine); if you have any dark thoughts, seek help immediately  I do encourage you to quit smoking Call [phone] to sign up for smoking cessation classes You can call 1-800-QUIT-NOW to talk with a smoking cessation coach   12 Ways to Curb Anxiety  ?Anxiety is normal human sensation. It is what helped our ancestors survive the pitfalls of the wilderness. Anxiety is defined as experiencing worry or nervousness about an imminent event or something with an uncertain outcome. It is a feeling experienced by most people at some point in their lives. Anxiety can be triggered by a very personal issue, such as the illness of a loved one, or an event of global proportions, such as a refugee crisis. Some of the symptoms of anxiety are:  Feeling restless.  Having a feeling of impending danger.  Increased heart rate.  Rapid breathing. Sweating.  Shaking.  Weakness or feeling tired.  Difficulty concentrating on anything except the current worry.  Insomnia.  Stomach or bowel problems. What can we do about anxiety we may be feeling? There are many techniques to help manage stress and relax. Here are 12 ways you can reduce your anxiety almost immediately: 1. Turn off the constant feed of information. Take a social media sabbatical. Studies have shown that social media directly contributes to social anxiety.  2. Monitor your television viewing habits. Are you watching shows that are also contributing to your anxiety, such as 24-hour news stations? Try watching something else, or better yet, nothing at all. Instead, listen to music, read an inspirational book or practice a hobby. 3. Eat nutritious meals. Also, don't skip meals and keep  healthful snacks on hand. Hunger and poor diet contributes to feeling anxious. 4. Sleep. Sleeping on a regular schedule for at least seven to eight hours a night will do wonders for your outlook when you are awake. 5. Exercise. Regular exercise will help rid your body of that anxious energy and help you get more restful sleep. 6. Try deep (diaphragmatic) breathing. Inhale slowly through your nose for five seconds and exhale through your mouth. 7. Practice acceptance and gratitude. When anxiety hits, accept that there are things out of your control that shouldn't be of immediate concern.  8. Seek out humor. When anxiety strikes, watch a funny video, read jokes or call a friend who makes you laugh. Laughter is healing for our bodies and releases endorphins that are calming. 9. Stay positive. Take the effort to replace negative thoughts with positive ones. Try to see a stressful situation in a positive light. Try to come up with solutions rather than dwelling on the problem. 10. Figure out what triggers your anxiety. Keep a journal and make note of anxious moments and the events surrounding them. This will help you identify triggers you can avoid or even eliminate. 11. Talk to someone. Let a trusted friend, family member or even trained professional know that you are feeling overwhelmed and anxious. Verbalize what you are feeling and why.  12. Volunteer. If your anxiety is triggered by a crisis on a large scale, become an advocate and work to resolve the problem that is causing you unease. Anxiety is  often unwelcome and can become overwhelming. If not kept in check, it can become a disorder that could require medical treatment. However, if you take the time to care for yourself and avoid the triggers that make you anxious, you will be able to find moments of relaxation and clarity that make your life much more enjoyable.    Smoking Cessation, Tips for Success If you are ready to quit smoking,  congratulations! You have chosen to help yourself be healthier. Cigarettes bring nicotine, tar, carbon monoxide, and other irritants into your body. Your lungs, heart, and blood vessels will be able to work better without these poisons. There are many different ways to quit smoking. Nicotine gum, nicotine patches, a nicotine inhaler, or nicotine nasal spray can help with physical craving. Hypnosis, support groups, and medicines help break the habit of smoking. WHAT THINGS CAN I DO TO MAKE QUITTING EASIER?  Here are some tips to help you quit for good:  Pick a date when you will quit smoking completely. Tell all of your friends and family about your plan to quit on that date.  Do not try to slowly cut down on the number of cigarettes you are smoking. Pick a quit date and quit smoking completely starting on that day.  Throw away all cigarettes.   Clean and remove all ashtrays from your home, work, and car.  On a card, write down your reasons for quitting. Carry the card with you and read it when you get the urge to smoke.  Cleanse your body of nicotine. Drink enough water and fluids to keep your urine clear or pale yellow. Do this after quitting to flush the nicotine from your body.  Learn to predict your moods. Do not let a bad situation be your excuse to have a cigarette. Some situations in your life might tempt you into wanting a cigarette.  Never have "just one" cigarette. It leads to wanting another and another. Remind yourself of your decision to quit.  Change habits associated with smoking. If you smoked while driving or when feeling stressed, try other activities to replace smoking. Stand up when drinking your coffee. Brush your teeth after eating. Sit in a different chair when you read the paper. Avoid alcohol while trying to quit, and try to drink fewer caffeinated beverages. Alcohol and caffeine may urge you to smoke.  Avoid foods and drinks that can trigger a desire to smoke, such as  sugary or spicy foods and alcohol.  Ask people who smoke not to smoke around you.  Have something planned to do right after eating or having a cup of coffee. For example, plan to take a walk or exercise.  Try a relaxation exercise to calm you down and decrease your stress. Remember, you may be tense and nervous for the first 2 weeks after you quit, but this will pass.  Find new activities to keep your hands busy. Play with a pen, coin, or rubber band. Doodle or draw things on paper.  Brush your teeth right after eating. This will help cut down on the craving for the taste of tobacco after meals. You can also try mouthwash.   Use oral substitutes in place of cigarettes. Try using lemon drops, carrots, cinnamon sticks, or chewing gum. Keep them handy so they are available when you have the urge to smoke.  When you have the urge to smoke, try deep breathing.  Designate your home as a nonsmoking area.  If you are a heavy smoker,  ask your health care provider about a prescription for nicotine chewing gum. It can ease your withdrawal from nicotine.  Reward yourself. Set aside the cigarette money you save and buy yourself something nice.  Look for support from others. Join a support group or smoking cessation program. Ask someone at home or at work to help you with your plan to quit smoking.  Always ask yourself, "Do I need this cigarette or is this just a reflex?" Tell yourself, "Today, I choose not to smoke," or "I do not want to smoke." You are reminding yourself of your decision to quit.  Do not replace cigarette smoking with electronic cigarettes (commonly called e-cigarettes). The safety of e-cigarettes is unknown, and some may contain harmful chemicals.  If you relapse, do not give up! Plan ahead and think about what you will do the next time you get the urge to smoke. HOW WILL I FEEL WHEN I QUIT SMOKING? You may have symptoms of withdrawal because your body is used to nicotine (the  addictive substance in cigarettes). You may crave cigarettes, be irritable, feel very hungry, cough often, get headaches, or have difficulty concentrating. The withdrawal symptoms are only temporary. They are strongest when you first quit but will go away within 10-14 days. When withdrawal symptoms occur, stay in control. Think about your reasons for quitting. Remind yourself that these are signs that your body is healing and getting used to being without cigarettes. Remember that withdrawal symptoms are easier to treat than the major diseases that smoking can cause.  Even after the withdrawal is over, expect periodic urges to smoke. However, these cravings are generally short lived and will go away whether you smoke or not. Do not smoke! WHAT RESOURCES ARE AVAILABLE TO HELP ME QUIT SMOKING? Your health care provider can direct you to community resources or hospitals for support, which may include:  Group support.  Education.  Hypnosis.  Therapy.   This information is not intended to replace advice given to you by your health care provider. Make sure you discuss any questions you have with your health care provider.   Document Released: 11/01/2003 Document Revised: 02/23/2014 Document Reviewed: 07/21/2012 Elsevier Interactive Patient Education [employer]

## 2015-08-08 NOTE — Assessment & Plan Note (Signed)
Encouraged her in her efforts to quit smoking; see AVS

## 2015-08-08 NOTE — Assessment & Plan Note (Signed)
No current SI; start back on paroxetine per her request; return for f/u in 4 weeks; if having dark thoughts or hypomania or mania, then seek medical help immediately

## 2015-08-08 NOTE — Assessment & Plan Note (Signed)
Refer to podiatrist ASAP; start back on gabapentin

## 2015-08-08 NOTE — Assessment & Plan Note (Signed)
Refer to podiatrist 

## 2015-08-08 NOTE — Progress Notes (Signed)
BP 127/82 mmHg  Pulse 77  Ht 5\' 5"  (1.651 m)  SpO2 100%   Subjective:    Patient ID: Brandi Swanson, female    DOB: [birth year], 40 y.o.   MRN: [id number]  HPI: Brandi Swanson E Swanson is a [age] y.o. female  Chief Complaint  Patient presents with  . COPD  . Hypertension   She here for left foot problems; first knot on bottom of left foot in 2005; adopted but found family; two toes turning black, permanent; painful; 2-3 months She uses voltaren gel 1%, uses that on left foot, uses BID, from podiatrist; tarsal tunnel syndrome; uses gabapentin; ran out of it for 2-3 months  She went to the hospital for COPD; doing better with extra inhaler  Migraines with aura; triggered by strong smells; avoids light and sound; uses imitrex and phenergan when needed; runs in the fmaily  Relevant past medical, surgical, family and social history reviewed Past Medical History  Diagnosis Date  . Hypertension   . Tarsal tunnel syndrome   . Tarsal tunnel syndrome of left side 08/08/2015  . Tobacco abuse 08/08/2015  . Migraine with aura 08/08/2015   Past Surgical History  Procedure Laterality Date  . Foot surgery Left     X's Three  . Tubal ligation     Family History  Problem Relation Age of Onset  . COPD Mother   . Heart failure Mother   . Hypertension Mother   . Hypertension Father   . Heart disease Father   . Hypertension Sister   . Hypertension Brother    Social History  Substance Use Topics  . Smoking status: Current Every Day Smoker -- 1.00 packs/day for 20 years    Types: Cigarettes  . Smokeless tobacco: Never Used  . Alcohol Use: No  MD notes: smoking just 6 cigs a day; wanting to quit but doesn't want more medicine right now; hopes to be able to do it on her own  Interim medical history since last visit reviewed. Allergies and medications reviewed  Review of Systems Per HPI unless specifically indicated above     Objective:    BP 127/82 mmHg  Pulse 77  Ht 5\' 5"  (1.651  m)  SpO2 100%  Wt Readings from Last 3 Encounters:  07/04/15 201 lb (91.173 kg)  06/24/15 198 lb (89.812 kg)  01/02/15 190 lb (86.183 kg)    Physical Exam  Constitutional: She appears well-developed and well-nourished. No distress.  Cardiovascular: Normal rate.   Pulmonary/Chest: Effort normal.  Musculoskeletal:       Left foot: There is tenderness and deformity (3rd toe somewhat shortened, drawn up; surgical scar over 3rd metatarsal dorsal surface).  Toenails on the 4th and 5th toe are darker; digits themselves are not violaceous or black or necrotic  Psychiatric: She has a normal mood and affect. Her behavior is normal. Her mood appears not anxious. Her affect is not blunt. Her speech is not rapid and/or pressured. She is not agitated, not aggressive, not hyperactive, not slowed and not withdrawn. Cognition and memory are not impaired. She does not express impulsivity or inappropriate judgment. She does not exhibit a depressed mood. She expresses no homicidal ideation.  Good eye contact with examiner, very pleasant She is attentive.    Results for orders placed or performed in visit on 08/08/15  POCT Glucose (CBG)  Result Value Ref Range   POC Glucose 116 (A) 70 - 99 mg/dl      Assessment & Plan:  Problem List Items Addressed This Visit      Cardiovascular and Mediastinum   Hypertension    Controlled today      Migraine with aura    Explained that she should not take imitrex if high blood pressure, risk of stroke      Relevant Medications   gabapentin (NEURONTIN) 300 MG capsule   PARoxetine (PAXIL) 20 MG tablet     Respiratory   COPD (chronic obstructive pulmonary disease) (HCC)    So glad to hear that she is doing better; smoking cessation will be so wonderful; encouragement given        Nervous and Auditory   Tarsal tunnel syndrome of left side    Refer to podiatrist ASAP; start back on gabapentin      Relevant Medications   gabapentin (NEURONTIN) 300 MG  capsule   PARoxetine (PAXIL) 20 MG tablet   Other Relevant Orders   Ambulatory referral to Podiatry     Musculoskeletal and Integument   Foot callus    Refer to podiatrist      Relevant Orders   Ambulatory referral to Podiatry     Other   Anxiety    Start back on paroxetine; see AVS for ideas for dealing with anxiety      Relevant Medications   PARoxetine (PAXIL) 20 MG tablet   Depression    No current SI; start back on paroxetine per her request; return for f/u in 4 weeks; if having dark thoughts or hypomania or mania, then seek medical help immediately      Relevant Medications   PARoxetine (PAXIL) 20 MG tablet   Foot pain, left - Primary    Continue voltaren, start back on gabapentin; refer to podiatrist      Relevant Orders   Ambulatory referral to Podiatry   Tobacco abuse    Encouraged her in her efforts to quit smoking; see AVS       Other Visit Diagnoses    Essential hypertension, benign        Relevant Orders    POCT Glucose (CBG) (Completed)       Follow up plan: Return in about 4 weeks (around 09/05/2015) for follow-up medicine.  An after-visit summary was printed and given to the patient at check-out.  Please see the patient instructions which may contain other information and recommendations beyond what is mentioned above in the assessment and plan.  Meds ordered this encounter  Medications  . gabapentin (NEURONTIN) 300 MG capsule    Sig: Take 1 capsule (300 mg total) by mouth 3 (three) times daily.    Dispense:  180 capsule    Refill:  0  . PARoxetine (PAXIL) 20 MG tablet    Sig: Take 1 tablet (20 mg total) by mouth daily.    Dispense:  30 tablet    Refill:  0  . diclofenac sodium (VOLTAREN) 1 % GEL    Sig: Apply 2 g topically 2 (two) times daily.    Dispense:  100 g    Refill:  2   Orders Placed This Encounter  Procedures  . Ambulatory referral to Podiatry  . POCT Glucose (CBG)

## 2015-08-08 NOTE — Assessment & Plan Note (Signed)
Start back on paroxetine; see AVS for ideas for dealing with anxiety

## 2015-08-08 NOTE — Assessment & Plan Note (Signed)
So glad to hear that she is doing better; smoking cessation will be so wonderful; encouragement given

## 2015-08-08 NOTE — Assessment & Plan Note (Signed)
Explained that she should not take imitrex if high blood pressure, risk of stroke

## 2015-08-08 NOTE — Assessment & Plan Note (Signed)
Controlled today 

## 2015-08-08 NOTE — Assessment & Plan Note (Signed)
Continue voltaren, start back on gabapentin; refer to podiatrist

## 2015-08-28 ENCOUNTER — Other Ambulatory Visit: Payer: Self-pay | Admitting: Urology

## 2015-08-29 ENCOUNTER — Ambulatory Visit: Payer: Self-pay

## 2015-08-29 ENCOUNTER — Encounter: Payer: Self-pay | Admitting: Pharmacist

## 2015-09-05 ENCOUNTER — Ambulatory Visit: Payer: Self-pay | Admitting: Nurse Practitioner

## 2015-09-05 VITALS — BP 123/80 | HR 74 | Temp 98.2°F | Wt 209.0 lb

## 2015-09-05 DIAGNOSIS — G43109 Migraine with aura, not intractable, without status migrainosus: Secondary | ICD-10-CM

## 2015-09-05 MED ORDER — CYCLOBENZAPRINE HCL 10 MG PO TABS: 10.0000 mg | ORAL_TABLET | Freq: Three times a day (TID) | ORAL | Status: DC | PRN

## 2015-09-05 MED ORDER — NORTRIPTYLINE HCL 10 MG PO CAPS: 10.0000 mg | ORAL_CAPSULE | Freq: Every day | ORAL | Status: DC

## 2015-09-05 NOTE — Progress Notes (Unsigned)
Is having headaches, since 40 years old,  Come, throbbing to front of head with nausea and vomiting Classic migraine h/s symptoms    Has not yet had her labs drawn,    Will start pamelor and flexeril.  Pt has anaphylaxis to ibuprofen, so will avoid nsaids, verbalizes agreement with said plan.

## 2015-09-19 ENCOUNTER — Other Ambulatory Visit: Payer: Self-pay

## 2015-09-26 ENCOUNTER — Ambulatory Visit: Payer: Self-pay

## 2015-10-08 ENCOUNTER — Ambulatory Visit: Payer: Self-pay

## 2015-10-18 ENCOUNTER — Other Ambulatory Visit: Payer: Self-pay | Admitting: Urology

## 2015-10-24 ENCOUNTER — Ambulatory Visit: Payer: Self-pay

## 2015-11-05 ENCOUNTER — Telehealth: Payer: Self-pay

## 2015-11-05 NOTE — Telephone Encounter (Signed)
Called to make pt appt. NO answer.

## 2015-11-13 ENCOUNTER — Telehealth: Payer: Self-pay | Admitting: Internal Medicine

## 2015-11-13 DIAGNOSIS — I10 Essential (primary) hypertension: Secondary | ICD-10-CM

## 2015-11-19 ENCOUNTER — Ambulatory Visit: Payer: Self-pay | Admitting: Urology

## 2015-11-19 VITALS — BP 112/76 | HR 83 | Wt 211.0 lb

## 2015-11-19 DIAGNOSIS — J410 Simple chronic bronchitis: Secondary | ICD-10-CM

## 2015-11-19 DIAGNOSIS — L84 Corns and callosities: Secondary | ICD-10-CM

## 2015-11-19 MED ORDER — NORTRIPTYLINE HCL 10 MG PO CAPS: 10.0000 mg | ORAL_CAPSULE | Freq: Every day | ORAL | 1 refills | Status: DC

## 2015-11-19 MED ORDER — FLUTICASONE-SALMETEROL 250-50 MCG/DOSE IN AEPB: 1.0000 | INHALATION_SPRAY | Freq: Two times a day (BID) | RESPIRATORY_TRACT | 3 refills | Status: DC

## 2015-11-19 NOTE — Progress Notes (Signed)
Patient: Brandi Swanson Female    DOB: 1975/03/15   40 y.o.   MRN: [id number] Visit Date: 11/19/2015  Today's Provider: [facility]   Chief Complaint  Patient presents with  . Foot Pain    Feet always hurt 10/10  . Headache    8/10   Subjective:    HPI Patient is having nausea with taking the HCTZ and gabapentin together.     Allergies  Allergen Reactions  . Ibuprofen Swelling   Previous Medications   ALBUTEROL (PROVENTIL HFA;VENTOLIN HFA) 108 (90 BASE) MCG/ACT INHALER    Inhale 2 puffs into the lungs every 6 (six) hours as needed for wheezing or shortness of breath.   ALBUTEROL (PROVENTIL HFA;VENTOLIN HFA) 108 (90 BASE) MCG/ACT INHALER    Inhale 2 puffs into the lungs every 6 (six) hours as needed for wheezing or shortness of breath.   AMITRIPTYLINE (ELAVIL) 50 MG TABLET    Take 1 tablet (50 mg total) by mouth at bedtime.   BISOPROLOL-HYDROCHLOROTHIAZIDE (ZIAC) 10-6.25 MG TABLET    Take 1 tablet by mouth daily.   BUDESONIDE-FORMOTEROL (SYMBICORT) 160-4.5 MCG/ACT INHALER    Inhale 2 puffs into the lungs 2 (two) times daily.   CYCLOBENZAPRINE (FLEXERIL) 10 MG TABLET    Take 1 tablet (10 mg total) by mouth 3 (three) times daily as needed for muscle spasms.   GABAPENTIN (NEURONTIN) 300 MG CAPSULE    Take 1 capsule (300 mg total) by mouth 3 (three) times daily.   HYDROCHLOROTHIAZIDE (HYDRODIURIL) 25 MG TABLET    Take 1 tablet (25 mg total) by mouth daily.   HYDROCODONE-ACETAMINOPHEN (NORCO) 5-325 MG PER TABLET    Take 1 tablet by mouth every 6 (six) hours as needed for moderate pain or severe pain.   LORATADINE (CLARITIN) 10 MG TABLET    Take 1 tablet (10 mg total) by mouth daily.   OMEPRAZOLE (PRILOSEC) 40 MG CAPSULE    Take 1 capsule (40 mg total) by mouth daily.   PROMETHAZINE (PHENERGAN) 25 MG TABLET    Take 1 tablet (25 mg total) by mouth every 6 (six) hours as needed for nausea or vomiting.   PROPRANOLOL (INDERAL) 40 MG TABLET    Take 1 tablet (40 mg total) by  mouth 2 (two) times daily.   VENTOLIN HFA 108 (90 BASE) MCG/ACT INHALER    INHALE 2 PUFFS INTO THE LUNGS EVERY 6 HOURS AS NEEDED FOR WHEEZING ORSHORTNESS OF BREATH.    Review of Systems  Social History  Substance Use Topics  . Smoking status: Current Every Day Smoker    Packs/day: 0.25    Years: 20.00    Types: Cigarettes  . Smokeless tobacco: Never Used  . Alcohol use No   Objective:   BP 112/76   Pulse 83   Wt 211 lb (95.7 kg)   SpO2 99%   BMI 35.11 kg/m  Constitutional: Well nourished. Alert and oriented, No acute distress. HEENT: [place] AT, moist mucus membranes. Trachea midline, no masses. Cardiovascular: No clubbing, cyanosis, or edema. Respiratory: Normal respiratory effort, no increased work of breathing. Skin: No rashes, bruises or suspicious lesions. Lymph: No cervical or inguinal adenopathy. Neurologic: Grossly intact, no focal deficits, moving all 4 extremities. Psychiatric: Normal mood and affect.  Physical Exam Foot: Left foot with good pulses, large callus in the bottom foot, black toenails     Assessment & Plan:     1. Nausea  - taking HCTZ and gabapentin together- separate the doses by two hours  and see if the nausea improves  2. Callus  - refer to podiatry  3. Asthma  - change Symbicort to Advair-sample given and script written  - refer to pulmonologist        [facility]   [facility]

## 2015-11-21 ENCOUNTER — Other Ambulatory Visit: Payer: Self-pay

## 2015-11-21 ENCOUNTER — Ambulatory Visit: Payer: Self-pay | Admitting: Licensed Clinical Social Worker

## 2015-11-28 ENCOUNTER — Ambulatory Visit: Payer: Self-pay

## 2015-12-10 NOTE — Telephone Encounter (Signed)
Patient wants an appointment for blood work and mental health counseling

## 2015-12-25 ENCOUNTER — Ambulatory Visit: Payer: Self-pay | Admitting: Pharmacy Technician

## 2015-12-26 ENCOUNTER — Ambulatory Visit: Payer: Self-pay | Admitting: Licensed Clinical Social Worker

## 2015-12-26 ENCOUNTER — Other Ambulatory Visit: Payer: Self-pay

## 2016-01-16 ENCOUNTER — Other Ambulatory Visit: Payer: Self-pay

## 2016-01-16 ENCOUNTER — Ambulatory Visit: Payer: Self-pay | Admitting: Licensed Clinical Social Worker

## 2016-01-16 ENCOUNTER — Ambulatory Visit: Payer: Self-pay | Admitting: Pulmonary Disease

## 2016-01-16 DIAGNOSIS — Z23 Encounter for immunization: Secondary | ICD-10-CM

## 2016-01-21 ENCOUNTER — Ambulatory Visit: Payer: Self-pay | Admitting: Pharmacy Technician

## 2016-01-21 ENCOUNTER — Telehealth: Payer: Self-pay | Admitting: Pharmacy Technician

## 2016-01-21 NOTE — Progress Notes (Addendum)
Patient scheduled for eligibility appointment at [facility].  Patient did not show for the appointment on 12/25/15 at 3:30pm.  Patient has had several missed appointments with [facility].  Verbally spoke with patient on the phone.  [facility] and [facility] that needs to be notarized.  Patient did not reschedule eligibility appointment.  Patient stated that she will return application along with notarized letter of support.

## 2016-01-21 NOTE — Progress Notes (Signed)
Spoke with patient via phone conversation about obtaining inhalers.  Explained to patient that we need notarized letter of support and completion of application before we can obtain inhalers.  Patient indicated that she has transportation on Tuesdays.  Patient asked if I could see her today, 01/21/16.  I indicated that I could see patient at 3:00p.m.  Patient called back later and spoke with Vernell and cancelled appt for today at 3:00p.m.  Patient has appt scheduled for eligibility on 02/25/16.  Patient wanted to know if there was availability prior to 02/25/16.  I called patient and left message.  Sherilyn DacostaBetty J. Jakiera Ehler Care Manager [facility]

## 2016-01-23 ENCOUNTER — Other Ambulatory Visit: Payer: Self-pay

## 2016-02-14 ENCOUNTER — Ambulatory Visit: Payer: Self-pay

## 2016-02-18 ENCOUNTER — Ambulatory Visit: Payer: Self-pay | Admitting: Pulmonary Disease

## 2016-02-20 ENCOUNTER — Ambulatory Visit: Payer: Self-pay

## 2016-02-25 ENCOUNTER — Ambulatory Visit: Payer: Self-pay | Admitting: Pharmacy Technician

## 2016-02-25 ENCOUNTER — Other Ambulatory Visit: Payer: Self-pay | Admitting: Urology

## 2016-02-25 DIAGNOSIS — Z79899 Other long term (current) drug therapy: Secondary | ICD-10-CM

## 2016-02-25 NOTE — Progress Notes (Signed)
Completed [facility] application and contract.  Patient agreed to all terms of the [facility] contract.  Provided patient with community resource material based on her particular needs.    Ventolin, Advair and Symbicort Prescription Applications completed with patient.  [facility].  Upon receipt of signed applications from provider,Ventolin and Advair Prescription Applications will be submitted to [employer] and Symbicort Prescription  Application will be submitted to [place].  Sherilyn DacostaBetty J. Mehtab Dolberry Care Manager [facility]

## 2016-02-27 ENCOUNTER — Ambulatory Visit: Payer: Self-pay | Admitting: Nurse Practitioner

## 2016-02-27 ENCOUNTER — Other Ambulatory Visit: Payer: Self-pay

## 2016-02-27 VITALS — BP 102/70 | HR 89 | Temp 98.5°F | Wt 211.0 lb

## 2016-02-27 DIAGNOSIS — I10 Essential (primary) hypertension: Secondary | ICD-10-CM

## 2016-02-27 DIAGNOSIS — E119 Type 2 diabetes mellitus without complications: Secondary | ICD-10-CM

## 2016-02-27 DIAGNOSIS — J449 Chronic obstructive pulmonary disease, unspecified: Secondary | ICD-10-CM

## 2016-02-27 DIAGNOSIS — E782 Mixed hyperlipidemia: Secondary | ICD-10-CM

## 2016-02-27 DIAGNOSIS — R5383 Other fatigue: Secondary | ICD-10-CM

## 2016-02-27 LAB — GLUCOSE, POCT (MANUAL RESULT ENTRY): POC Glucose: 123 mg/dl — AB (ref 70–99)

## 2016-02-27 MED ORDER — LACTULOSE 10 GM/15ML PO SOLN: 10.0000 g | Freq: Three times a day (TID) | ORAL | 3 refills | Status: DC

## 2016-02-27 MED ORDER — CEFPROZIL 500 MG PO TABS: 500.0000 mg | ORAL_TABLET | Freq: Two times a day (BID) | ORAL | 0 refills | Status: AC

## 2016-02-27 MED ORDER — LORATADINE 10 MG PO TABS: 10.0000 mg | ORAL_TABLET | Freq: Every day | ORAL | 0 refills | Status: DC

## 2016-02-27 MED ORDER — CYCLOBENZAPRINE HCL 10 MG PO TABS: 10.0000 mg | ORAL_TABLET | Freq: Three times a day (TID) | ORAL | 0 refills | Status: DC | PRN

## 2016-02-27 MED ORDER — PROPRANOLOL HCL 40 MG PO TABS: 40.0000 mg | ORAL_TABLET | Freq: Two times a day (BID) | ORAL | 0 refills | Status: DC

## 2016-02-27 MED ORDER — OMEPRAZOLE 40 MG PO CPDR: 40.0000 mg | DELAYED_RELEASE_CAPSULE | Freq: Every day | ORAL | 0 refills | Status: DC

## 2016-02-27 MED ORDER — PROMETHAZINE HCL 25 MG PO TABS: 25.0000 mg | ORAL_TABLET | Freq: Four times a day (QID) | ORAL | 0 refills | Status: DC | PRN

## 2016-02-27 MED ORDER — AMITRIPTYLINE HCL 50 MG PO TABS: 50.0000 mg | ORAL_TABLET | Freq: Every day | ORAL | 2 refills | Status: DC

## 2016-02-27 MED ORDER — GABAPENTIN 300 MG PO CAPS: 300.0000 mg | ORAL_CAPSULE | Freq: Three times a day (TID) | ORAL | 0 refills | Status: DC

## 2016-02-27 MED ORDER — HYDROCHLOROTHIAZIDE 25 MG PO TABS: ORAL_TABLET | ORAL | 0 refills | Status: DC

## 2016-02-27 MED ORDER — BISOPROLOL-HYDROCHLOROTHIAZIDE 10-6.25 MG PO TABS: 1.0000 | ORAL_TABLET | Freq: Every day | ORAL | 0 refills | Status: DC

## 2016-02-27 NOTE — Progress Notes (Signed)
Has not started any diabetes meds, states she was told that she had diabetes  Having urinary symptoms, frequency, with only small amounts, leakage with coughing or laughing too hard.   Urine has had strong smell  Has stopped soda, now just drinking water  Ongoing problem with constipation   Exam:   Alert verbally appropriate AP rrr, no carotid bruits, no thyroid nodules, no adenopathy BBrS diminished as expected in a smoker          Plan:    Constipation will implement lactulose Will renew her maintenance, will cancel pamelor as she is already on eleval Will treat urinary symptoms presumptively with antibiotics and re-eval at next visit, will treat with cefzil.    Will draw all maintenance labs, and bring pt back for a quick visit to review and adjust treatment as indicated.    Is to keep her appointment with dr. Meredeth Idefleming

## 2016-02-28 LAB — COMPREHENSIVE METABOLIC PANEL
ALT: 19 IU/L (ref 0–32)
AST: 19 IU/L (ref 0–40)
Albumin/Globulin Ratio: 1.8 (ref 1.2–2.2)
Albumin: 4.1 g/dL (ref 3.5–5.5)
Alkaline Phosphatase: 59 IU/L (ref 39–117)
BUN/Creatinine Ratio: 11 (ref 9–23)
BUN: 10 mg/dL (ref 6–24)
Bilirubin Total: <0.2 mg/dL (ref 0.0–1.2)
CALCIUM: 8.9 mg/dL (ref 8.7–10.2)
CO2: 23 mmol/L (ref 18–29)
CREATININE: 0.87 mg/dL (ref 0.57–1.00)
Chloride: 103 mmol/L (ref 96–106)
GFR calc Af Amer: 95 mL/min/{1.73_m2} (ref >59)
GFR, EST NON AFRICAN AMERICAN: 82 mL/min/{1.73_m2} (ref >59)
GLOBULIN, TOTAL: 2.3 g/dL (ref 1.5–4.5)
Glucose: 108 mg/dL — ABNORMAL HIGH (ref 65–99)
Potassium: 4.2 mmol/L (ref 3.5–5.2)
Sodium: 142 mmol/L (ref 134–144)
TOTAL PROTEIN: 6.4 g/dL (ref 6.0–8.5)

## 2016-02-28 LAB — CBC WITH DIFFERENTIAL/PLATELET
BASOS: 0 %
Basophils Absolute: 0 10*3/uL (ref 0.0–0.2)
EOS (ABSOLUTE): 0.2 10*3/uL (ref 0.0–0.4)
EOS: 3 %
HEMATOCRIT: 38.5 % (ref 34.0–46.6)
Hemoglobin: 12.9 g/dL (ref 11.1–15.9)
IMMATURE GRANULOCYTES: 0 %
Immature Grans (Abs): 0 10*3/uL (ref 0.0–0.1)
LYMPHS ABS: 3 10*3/uL (ref 0.7–3.1)
Lymphs: 46 %
MCH: 28.1 pg (ref 26.6–33.0)
MCHC: 33.5 g/dL (ref 31.5–35.7)
MCV: 84 fL (ref 79–97)
MONOS ABS: 0.5 10*3/uL (ref 0.1–0.9)
Monocytes: 8 %
NEUTROS ABS: 2.9 10*3/uL (ref 1.4–7.0)
Neutrophils: 43 %
Platelets: 275 10*3/uL (ref 150–379)
RBC: 4.59 x10E6/uL (ref 3.77–5.28)
RDW: 15.8 % — AB (ref 12.3–15.4)
WBC: 6.6 10*3/uL (ref 3.4–10.8)

## 2016-02-28 LAB — LIPID PANEL
CHOLESTEROL TOTAL: 143 mg/dL (ref 100–199)
Chol/HDL Ratio: 3.6 ratio units (ref 0.0–4.4)
HDL: 40 mg/dL (ref >39)
LDL Calculated: 74 mg/dL (ref 0–99)
TRIGLYCERIDES: 147 mg/dL (ref 0–149)
VLDL CHOLESTEROL CAL: 29 mg/dL (ref 5–40)

## 2016-02-28 LAB — TSH: TSH: 0.813 u[IU]/mL (ref 0.450–4.500)

## 2016-02-28 LAB — HEMOGLOBIN A1C
Est. average glucose Bld gHb Est-mCnc: 126 mg/dL
Hgb A1c MFr Bld: 6 % — ABNORMAL HIGH (ref 4.8–5.6)

## 2016-03-09 ENCOUNTER — Ambulatory Visit: Payer: Self-pay | Admitting: Internal Medicine

## 2016-03-10 ENCOUNTER — Other Ambulatory Visit: Payer: Self-pay

## 2016-03-10 ENCOUNTER — Ambulatory Visit: Payer: Self-pay

## 2016-03-10 NOTE — Telephone Encounter (Signed)
Received PAP application from [facility] for Advair placed for provider to sign. 

## 2016-03-10 NOTE — Telephone Encounter (Signed)
[facility] and Ventolin placed for provider to sign.

## 2016-03-13 NOTE — Telephone Encounter (Signed)
Placed signed application/script in [facility] folder for pickup. 

## 2016-03-23 ENCOUNTER — Telehealth: Payer: Self-pay | Admitting: Pharmacist

## 2016-03-23 NOTE — Telephone Encounter (Signed)
Advair, Ventolin & Symbicort PAP submitted to manufacturers today.

## 2016-04-21 ENCOUNTER — Ambulatory Visit: Payer: Self-pay

## 2016-04-23 ENCOUNTER — Encounter: Payer: Self-pay | Admitting: Internal Medicine

## 2016-04-23 ENCOUNTER — Ambulatory Visit (INDEPENDENT_AMBULATORY_CARE_PROVIDER_SITE_OTHER): Payer: Self-pay | Admitting: Internal Medicine

## 2016-04-23 VITALS — BP 136/82 | HR 104 | Ht 65.0 in | Wt 211.0 lb

## 2016-04-23 DIAGNOSIS — T17308A Unspecified foreign body in larynx causing other injury, initial encounter: Secondary | ICD-10-CM

## 2016-04-23 DIAGNOSIS — J4550 Severe persistent asthma, uncomplicated: Secondary | ICD-10-CM

## 2016-04-23 DIAGNOSIS — F17200 Nicotine dependence, unspecified, uncomplicated: Secondary | ICD-10-CM

## 2016-04-23 MED ORDER — FLUTICASONE PROPIONATE 50 MCG/ACT NA SUSP: 2.0000 | Freq: Every day | NASAL | 2 refills | Status: DC

## 2016-04-23 NOTE — Patient Instructions (Addendum)
You have asthma, this is made worse by three things in you, smoking, reflux, allergies.   --Start flonase 2 sprays in each nostril twice daily.   --Continue omeprazole.   --Keep pet out of bedroom.   --Refer to gastroenterology due to choking while eating.   --Quitting smoking is the most important thing that you can do for your health.  --Quitting smoking will have greater affect on your health than any medicine that we can give you.

## 2016-04-23 NOTE — Progress Notes (Signed)
[facility]  Pulmonary Medicine Consultation      Assessment and Plan:  Cough.   Asthma.   Chronic bronchitis.   Nicotine abuse.   GERD with dysphagia.  --Likely contributing to asthma.  --  Chronic rhinitis.     Date: 04/23/2016  MRN# [id number] IO DIEUJUSTE March 16, 1975   JAEDA BRUSO is a 41 y.o. old female seen in consultation for chief complaint of:    Chief Complaint  Patient presents with  . Advice Only    ref by Gilford Silvius, PA: SOB at night lying in bed: chest pain; chest tightness: prod cough w/thick clear mucus:    HPI:  The patient is a 41 year old smoker, she presents with shortness of breath, chest tightness. She notes that a few months ago she had dyspnea and was coughing up blood and went to the ER. She is currently taking advair bid, and ventolin which she takes every 4 to 4 hours. She feels that the ventolin helps with her breathing.  She is currently smoking half ppd down from 2.5 ppd about 6 months ago.  She has persistent cough and particularly at night. She has woken up from sleep gasping for breath.   She does not think that she snores at night, she is tired during the day. She has a dog at home, not in bedroom.  She takes zyrtec daily, she continue to runny and stuffy nose.  She notes that she has very bad hearburn, especially brought on by certain foods. She has been cutting down on those, and has lost almost 100 lbs over the past few years. She takes omeprazole 40 mg once daily, as well as zantac. She continues to have significant symptoms despite being on these.   She notes that when she is eating, she feels that she gets choked and can not swallow the food. This happens once or twice per day, and has to chop her food down really finely.   Reviewed the images, CT chest 06/24/15: Unremarkable Lungs.    PMHX:   Past Medical History:  Diagnosis Date  . Hypertension   . Migraine with aura 08/08/2015  . Tarsal tunnel syndrome   . Tarsal  tunnel syndrome of left side 08/08/2015  . Tobacco abuse 08/08/2015   Surgical Hx:  Past Surgical History:  Procedure Laterality Date  . FOOT SURGERY Left    X's Three  . TUBAL LIGATION     Family Hx:  Family History  Problem Relation Age of Onset  . COPD Mother   . Heart failure Mother   . Hypertension Mother   . Hypertension Father   . Heart disease Father   . Hypertension Sister   . Hypertension Brother    Social Hx:   Social History  Substance Use Topics  . Smoking status: Current Every Day Smoker    Packs/day: 0.50    Years: 20.00    Types: Cigarettes  . Smokeless tobacco: Never Used  . Alcohol use No   Medication:   Reviewed    Allergies:  Ibuprofen  Review of Systems: Gen:  Denies  fever, sweats, chills HEENT: Denies blurred vision, double vision. bleeds, sore throat Cvc:  No dizziness, chest pain. Resp:   Denies cough or sputum production, shortness of breath Gi: Denies swallowing difficulty, stomach pain. Gu:  Denies bladder incontinence, burning urine Ext:   No Joint pain, stiffness. Skin: No skin rash,  hives  Endoc:  No polyuria, polydipsia. Psych: No depression, insomnia. Other:  All other systems  were reviewed with the patient and were negative other that what is mentioned in the HPI.   Physical Examination:   VS: BP 136/82 (BP Location: Left Arm, Cuff Size: Normal)   Pulse (!) 104   Ht 5\' 5"  (1.651 m)   Wt 211 lb (95.7 kg)   SpO2 100%   BMI 35.11 kg/m   General Appearance: No distress  Neuro:without focal findings,  speech normal,  HEENT: PERRLA, EOM intact.   Pulmonary: normal breath sounds, No wheezing.  CardiovascularNormal S1,S2.  No m/r/g.   Abdomen: Benign, Soft, non-tender. Renal:  No costovertebral tenderness  GU:  No performed at this time. Endoc: No evident thyromegaly, no signs of acromegaly. Skin:   warm, no rashes, no ecchymosis  Extremities: normal, no cyanosis, clubbing.  Other findings:    LABORATORY PANEL:    CBC No results for input(s): WBC, HGB, HCT, PLT in the last 168 hours. ------------------------------------------------------------------------------------------------------------------  Chemistries  No results for input(s): NA, K, CL, CO2, GLUCOSE, BUN, CREATININE, CALCIUM, MG, AST, ALT, ALKPHOS, BILITOT in the last 168 hours.  Invalid input(s): GFRCGP ------------------------------------------------------------------------------------------------------------------  Cardiac Enzymes No results for input(s): TROPONINI in the last 168 hours. ------------------------------------------------------------  RADIOLOGY:  No results found.     Thank  you for the consultation and for allowing [facility]  Pulmonary, Critical Care to assist in the care of your patient. Our recommendations are noted above.  Please contact us if we can be of further service.   Wells Guileseep Haniya Fern, MD.  Board Certified in Internal Medicine, Pulmonary Medicine, Critical Care Medicine, and Sleep Medicine.   Pulmonary and Critical Care Office Number: [phone]  Santiago Gladavid Kasa, M.D.  Stephanie AcreVishal Mungal, M.D.  Billy Fischeravid Simonds, M.D  04/23/2016

## 2016-05-18 ENCOUNTER — Ambulatory Visit: Payer: Self-pay | Admitting: Gastroenterology

## 2016-05-26 ENCOUNTER — Telehealth: Payer: Self-pay | Admitting: Pharmacist

## 2016-05-26 NOTE — Telephone Encounter (Signed)
05/26/16 Ordered refill for Ventolin & Advair 250/50 online, will ship 06/08/2016, order# [id number].

## 2016-06-10 ENCOUNTER — Other Ambulatory Visit: Payer: Self-pay | Admitting: Internal Medicine

## 2016-08-13 ENCOUNTER — Telehealth: Payer: Self-pay

## 2016-08-13 NOTE — Telephone Encounter (Signed)
Patient called for appt.  Left message.

## 2016-09-08 ENCOUNTER — Ambulatory Visit: Payer: Self-pay

## 2016-09-11 ENCOUNTER — Other Ambulatory Visit: Payer: Self-pay | Admitting: Internal Medicine

## 2016-10-21 ENCOUNTER — Telehealth: Payer: Self-pay | Admitting: Pharmacist

## 2016-10-21 NOTE — Telephone Encounter (Signed)
10/21/16 Placed refill on Advair 250/50 & Ventolin HFA online with [facility], to release 11/03/16, order# [id number].Forde RadonAJ

## 2016-12-07 ENCOUNTER — Other Ambulatory Visit: Payer: Self-pay | Admitting: Internal Medicine

## 2016-12-07 DIAGNOSIS — I10 Essential (primary) hypertension: Secondary | ICD-10-CM

## 2016-12-23 ENCOUNTER — Telehealth: Payer: Self-pay | Admitting: Licensed Clinical Social Worker

## 2016-12-23 NOTE — Telephone Encounter (Signed)
It's been awhile since patient's last follow up appointment for therapy and primary care. Clinician reached out to the patient to get her back into therapy and due to not having a follow up in awhile, needs an appointment in order for Symbicort to be prescribed.

## 2017-01-12 ENCOUNTER — Telehealth: Payer: Self-pay | Admitting: Pharmacist

## 2017-01-12 NOTE — Telephone Encounter (Signed)
01/12/17 Placed refill online with [facility] for Advair 250/50 & Ventolin HFA, to release 01/25/17, order# [id number].Forde RadonAJ

## 2017-02-26 ENCOUNTER — Telehealth: Payer: Self-pay | Admitting: Pharmacist

## 2017-02-26 NOTE — Telephone Encounter (Signed)
02/26/17 Faxed [facility] renewal application & scripts-Ventolin HFA 90mcg Inhale 2 puffs into the lungs every 6 hours as needed for wheezing or shortness of breath & Advair 250/50 Inhale 1 puff into the lungs two times a day. Patient enrollment with [facility] was to end 03/22/17.Forde RadonAJ

## 2017-03-04 ENCOUNTER — Other Ambulatory Visit: Payer: Self-pay | Admitting: Urology

## 2017-03-15 ENCOUNTER — Emergency Department
Admission: EM | Admit: 2017-03-15 | Discharge: 2017-03-16 | Disposition: A | Discharge status: Home / Self Care | Payer: Self-pay | Attending: Emergency Medicine | Admitting: Emergency Medicine

## 2017-03-15 DIAGNOSIS — G8929 Other chronic pain: Secondary | ICD-10-CM

## 2017-03-15 DIAGNOSIS — Z79899 Other long term (current) drug therapy: Secondary | ICD-10-CM | POA: Insufficient documentation

## 2017-03-15 DIAGNOSIS — I10 Essential (primary) hypertension: Secondary | ICD-10-CM | POA: Insufficient documentation

## 2017-03-15 DIAGNOSIS — G43909 Migraine, unspecified, not intractable, without status migrainosus: Secondary | ICD-10-CM | POA: Insufficient documentation

## 2017-03-15 DIAGNOSIS — F17210 Nicotine dependence, cigarettes, uncomplicated: Secondary | ICD-10-CM | POA: Insufficient documentation

## 2017-03-15 DIAGNOSIS — R51 Headache: Secondary | ICD-10-CM

## 2017-03-15 DIAGNOSIS — J449 Chronic obstructive pulmonary disease, unspecified: Secondary | ICD-10-CM | POA: Insufficient documentation

## 2017-03-15 MED ORDER — BUTALBITAL-APAP-CAFFEINE 50-325-40 MG PO TABS
1.0000 | ORAL_TABLET | Freq: Once | ORAL | Status: AC
  Administered 2017-03-15: 1 via ORAL
  Filled 2017-03-15: qty 1

## 2017-03-15 MED ORDER — ONDANSETRON 4 MG PO TBDP
4.0000 mg | ORAL_TABLET | Freq: Once | ORAL | Status: AC
  Administered 2017-03-15: 4 mg via ORAL
  Filled 2017-03-15: qty 1

## 2017-03-15 NOTE — ED Triage Notes (Signed)
Patient c/o headache and emesis X 2 days. Patient reports hx of migraines. Patient reports she took excedrin approx 4 hours ago. Patient denies relief of symptoms.

## 2017-03-16 MED ORDER — BUTALBITAL-APAP-CAFFEINE 50-325-40 MG PO TABS: 1.0000 | ORAL_TABLET | Freq: Four times a day (QID) | ORAL | 0 refills | Status: DC | PRN

## 2017-03-16 MED ORDER — DIPHENHYDRAMINE HCL 50 MG/ML IJ SOLN
25.0000 mg | Freq: Once | INTRAMUSCULAR | Status: AC
  Administered 2017-03-16: 25 mg via INTRAVENOUS
  Filled 2017-03-16: qty 1

## 2017-03-16 MED ORDER — METOCLOPRAMIDE HCL 5 MG/ML IJ SOLN
10.0000 mg | Freq: Once | INTRAMUSCULAR | Status: AC
  Administered 2017-03-16: 10 mg via INTRAVENOUS
  Filled 2017-03-16: qty 2

## 2017-03-16 MED ORDER — SODIUM CHLORIDE 0.9 % IV BOLUS (SEPSIS)
1000.0000 mL | Freq: Once | INTRAVENOUS | Status: AC
  Administered 2017-03-16: 1000 mL via INTRAVENOUS

## 2017-03-16 MED ORDER — METHYLPREDNISOLONE SODIUM SUCC 125 MG IJ SOLR
125.0000 mg | Freq: Once | INTRAMUSCULAR | Status: AC
  Administered 2017-03-16: 125 mg via INTRAVENOUS
  Filled 2017-03-16: qty 2

## 2017-03-16 NOTE — ED Provider Notes (Signed)
[facility] Emergency Department Provider Note   ____________________________________________   First MD Initiated Contact with Patient 03/16/17 0000     (approximate)  I have reviewed the triage vital signs and the nursing notes.   HISTORY  Chief Complaint Headache    HPI Brandi Swanson is a 42 y.o. female who comes into the hospital today with a headache.  The patient has a history of migraines.  She also has a history of high blood pressure.  She has had a headache for 2 weeks and reports she has been vomiting.  She tried staying home and taking Excedrin and relaxing and also trying to meditate but it did not help.  The patient reports that [facility] and they do not feel her headache pain medicine.  She reports that she has headaches 22 days out of the month.  Today she also had some dizziness and a nosebleed which is not common for her so she became scared and decided to come into the hospital and get checked out.  The patient rates her pain a 6 out of 10 in intensity.  She denies any sensitivity to light or sound at this time.  Past Medical History:  Diagnosis Date  . Hypertension   . Migraine with aura 08/08/2015  . Tarsal tunnel syndrome   . Tarsal tunnel syndrome of left side 08/08/2015  . Tobacco abuse 08/08/2015    Patient Active Problem List   Diagnosis Date Noted  . Migraine headache with aura 09/05/2015  . Foot pain, left 08/08/2015  . Tarsal tunnel syndrome of left side 08/08/2015  . Foot callus 08/08/2015  . Tobacco abuse 08/08/2015  . Migraine with aura 08/08/2015  . COPD (chronic obstructive pulmonary disease) (HCC) 07/04/2015  . Seasonal allergies 07/04/2015  . Acid reflux 07/04/2015  . Hypertension 07/04/2015  . Depression 07/04/2015  . Anxiety 07/04/2015    Past Surgical History:  Procedure Laterality Date  . FOOT SURGERY Left    X's Three  . TUBAL LIGATION      Prior to Admission medications     Medication Sig Start Date End Date Taking? Authorizing Provider  albuterol (PROVENTIL HFA;VENTOLIN HFA) 108 (90 Base) MCG/ACT inhaler Inhale 2 puffs into the lungs every 6 (six) hours as needed for wheezing or shortness of breath. 07/04/15   Michiel Cowboy A, PA-C  amitriptyline (ELAVIL) 50 MG tablet Take 1 tablet by mouth at bedtime. 12/08/16   Virl Axe, MD  bisoprolol-hydrochlorothiazide (ZIAC) 10-6.25 MG tablet Take 1 tablet by mouth daily. 02/27/16   Zachery Dauer, FNP  butalbital-acetaminophen-caffeine (FIORICET, ESGIC) [phone] MG tablet Take 1-2 tablets by mouth every 6 (six) hours as needed for headache. 03/16/17 03/16/18  Rebecka Apley, MD  cetirizine (ZYRTEC) 10 MG tablet TAKE ONE TABLET BY MOUTH EVERY DAY. REPLACES LORATADINE 09/11/16   Virl Axe, MD  cyclobenzaprine (FLEXERIL) 10 MG tablet TAKE ONE TABLET BY MOUTH 3 TIMES A DAY AS NEEDED FOR MUSCLE SPASMS 09/11/16   Virl Axe, MD  fluticasone (FLONASE) 50 MCG/ACT nasal spray Place 2 sprays into both nostrils daily. 04/23/16   Shane Crutch, MD  Fluticasone-Salmeterol (ADVAIR DISKUS) 250-50 MCG/DOSE AEPB Inhale 1 puff into the lungs 2 (two) times daily. 11/19/15   McGowan, Carollee Herter A, PA-C  gabapentin (NEURONTIN) 300 MG capsule TAKE ONE CAPSULE BY MOUTH 3 TIMES A DAY 12/08/16   Virl Axe, MD  hydrochlorothiazide (HYDRODIURIL) 25 MG tablet TAKE ONE TABLET BY MOUTH  EVERY DAY 09/11/16   Virl Axehaplin, Don C, MD  HYDROcodone-acetaminophen (NORCO) 5-325 MG per tablet Take 1 tablet by mouth every 6 (six) hours as needed for moderate pain or severe pain. 07/02/14   Ruffian, III Kristine GarbeWilliam C, PA-C  lactulose (CHRONULAC) 10 GM/15ML solution Take 15 mLs (10 g total) by mouth 3 (three) times daily. 02/27/16   Zachery Dauerdem, Donna S, FNP  loratadine (CLARITIN) 10 MG tablet Take 1 tablet (10 mg total) by mouth daily. 02/27/16 02/28/17  Zachery Dauerdem, Donna S, FNP  omeprazole (PRILOSEC) 20 MG capsule TAKE 2 CAPSULES (40MG ) BY MOUTH EVERY DAY 06/10/16    Virl Axehaplin, Don C, MD  promethazine (PHENERGAN) 25 MG tablet TAKE ONE TABLET BY MOUTH EVERY 6 HOURS AS NEEDED FOR NAUSEA OR VOMNTING 09/11/16   Virl Axehaplin, Don C, MD  propranolol (INDERAL) 40 MG tablet Take 1 tablet (40 mg total) by mouth 2 (two) times daily. 02/27/16   Zachery Dauerdem, Donna S, FNP  propranolol (INDERAL) 40 MG tablet Take 1 tablet by mouth 2 times daily. 09/11/16   Virl Axehaplin, Don C, MD    Allergies Ibuprofen  Family History  Problem Relation Age of Onset  . COPD Mother   . Heart failure Mother   . Hypertension Mother   . Hypertension Father   . Heart disease Father   . Hypertension Sister   . Hypertension Brother     Social History Social History   Tobacco Use  . Smoking status: Current Every Day Smoker    Packs/day: 0.50    Years: 20.00    Pack years: 10.00    Types: Cigarettes  . Smokeless tobacco: Never Used  Substance Use Topics  . Alcohol use: No    Alcohol/week: 0.0 oz  . Drug use: No    Review of Systems  Constitutional: No fever/chills Eyes: No visual changes. ENT: No sore throat. Cardiovascular: Denies chest pain. Respiratory: Denies shortness of breath. Gastrointestinal: No abdominal pain.  No nausea, no vomiting.  No diarrhea.  No constipation. Genitourinary: Negative for dysuria. Musculoskeletal: Negative for back pain. Skin: Negative for rash. Neurological: Headache and dizziness   ____________________________________________   PHYSICAL EXAM:  VITAL SIGNS: ED Triage Vitals  Enc Vitals Group     BP 03/15/17 2038 (!) 157/107     Pulse Rate 03/15/17 2038 80     Resp 03/15/17 2038 17     Temp 03/15/17 2038 98.4 F (36.9 C)     Temp Source 03/15/17 2038 Oral     SpO2 03/15/17 2038 97 %     Weight 03/15/17 2039 180 lb (81.6 kg)     Height 03/15/17 2039 5\' 5"  (1.651 m)     Head Circumference --      Peak Flow --      Pain Score 03/15/17 2039 9     Pain Loc --      Pain Edu? --      Excl. in GC? --     Constitutional: Alert and oriented.  Well appearing and in moderate distress. Eyes: Conjunctivae are normal. PERRL. EOMI. Head: Atraumatic. Nose: No congestion/rhinnorhea. Mouth/Throat: Mucous membranes are moist.  Oropharynx non-erythematous. Cardiovascular: Normal rate, regular rhythm. Grossly normal heart sounds.  Good peripheral circulation. Respiratory: Normal respiratory effort.  No retractions. Lungs CTAB. Gastrointestinal: Soft and nontender. No distention. Positive bowel sounds Musculoskeletal: No lower extremity tenderness nor edema.   Neurologic:  Normal speech and language.  Cranial nerves II through XII are grossly intact with no focal motor neuro deficit Skin:  Skin  is warm, dry and intact.  Psychiatric: Mood and affect are normal.   ____________________________________________   LABS (all labs ordered are listed, but only abnormal results are displayed)  Labs Reviewed - No data to display ____________________________________________  EKG  none ____________________________________________  RADIOLOGY  none  ____________________________________________   PROCEDURES  Procedure(s) performed: None  Procedures  Critical Care performed: No  ____________________________________________   INITIAL IMPRESSION / ASSESSMENT AND PLAN / ED COURSE  As part of my medical decision making, I reviewed the following data within the electronic MEDICAL RECORD NUMBER Notes from prior ED visits and [place] Controlled Substance Database   This is a 42 year old female who comes into the hospital today with a migraine headache.  The patient states that she had some dizziness nausea and vomiting.  Since the patient feels that with the pain of the headache is similar to previous I will not do any imaging studies.  I will give the patient a dose of Reglan, Benadryl, Solu-Medrol and a liter of normal saline.  I will reassess the patient when she has had the medication to determine if her pain is improved.     The patient's  headache is improved.  She will be discharged home to follow-up with her primary care physician and neurology. ____________________________________________   FINAL CLINICAL IMPRESSION(S) / ED DIAGNOSES  Final diagnoses:  Chronic nonintractable headache, unspecified headache type  Migraine without status migrainosus, not intractable, unspecified migraine type     ED Discharge Orders        Ordered    butalbital-acetaminophen-caffeine (FIORICET, ESGIC) 50-325-40 MG tablet  Every 6 hours PRN     03/16/17 0117       Note:  This document was prepared using Dragon voice recognition software and may include unintentional dictation errors.    Rebecka Apley, MD 03/16/17 [phone]

## 2017-03-16 NOTE — ED Notes (Addendum)
Pt discharged by Dr. Zenda AlpersWebster; Pt left before receiving discharge paperwork and prescription. Pt was called and voice mail was left about her prescription being left.

## 2017-03-16 NOTE — Discharge Instructions (Signed)
Please follow up with your primary care physician for further evalution

## 2017-03-17 ENCOUNTER — Other Ambulatory Visit: Payer: Self-pay | Admitting: Internal Medicine

## 2017-06-04 ENCOUNTER — Encounter: Payer: Self-pay | Admitting: Emergency Medicine

## 2017-06-04 ENCOUNTER — Other Ambulatory Visit: Payer: Self-pay

## 2017-06-04 ENCOUNTER — Emergency Department
Admission: EM | Admit: 2017-06-04 | Discharge: 2017-06-04 | Disposition: A | Discharge status: Home / Self Care | Payer: Self-pay | Attending: Student in an Organized Health Care Education/Training Program | Admitting: Student in an Organized Health Care Education/Training Program

## 2017-06-04 DIAGNOSIS — L299 Pruritus, unspecified: Secondary | ICD-10-CM | POA: Insufficient documentation

## 2017-06-04 DIAGNOSIS — F419 Anxiety disorder, unspecified: Secondary | ICD-10-CM | POA: Insufficient documentation

## 2017-06-04 DIAGNOSIS — I10 Essential (primary) hypertension: Secondary | ICD-10-CM | POA: Insufficient documentation

## 2017-06-04 DIAGNOSIS — Z79899 Other long term (current) drug therapy: Secondary | ICD-10-CM | POA: Insufficient documentation

## 2017-06-04 DIAGNOSIS — J449 Chronic obstructive pulmonary disease, unspecified: Secondary | ICD-10-CM | POA: Insufficient documentation

## 2017-06-04 DIAGNOSIS — F17210 Nicotine dependence, cigarettes, uncomplicated: Secondary | ICD-10-CM | POA: Insufficient documentation

## 2017-06-04 DIAGNOSIS — F329 Major depressive disorder, single episode, unspecified: Secondary | ICD-10-CM | POA: Insufficient documentation

## 2017-06-04 LAB — COMPREHENSIVE METABOLIC PANEL
ALBUMIN: 3.9 g/dL (ref 3.5–5.0)
ALT: 16 U/L (ref 14–54)
AST: 22 U/L (ref 15–41)
Alkaline Phosphatase: 52 U/L (ref 38–126)
Anion gap: 6 (ref 5–15)
BUN: 13 mg/dL (ref 6–20)
CHLORIDE: 111 mmol/L (ref 101–111)
CO2: 22 mmol/L (ref 22–32)
CREATININE: 0.88 mg/dL (ref 0.44–1.00)
Calcium: 8.8 mg/dL — ABNORMAL LOW (ref 8.9–10.3)
GFR calc Af Amer: >60 mL/min (ref >60)
GFR calc non Af Amer: >60 mL/min (ref >60)
GLUCOSE: 103 mg/dL — AB (ref 65–99)
POTASSIUM: 3.4 mmol/L — AB (ref 3.5–5.1)
Sodium: 139 mmol/L (ref 135–145)
Total Bilirubin: 0.2 mg/dL — ABNORMAL LOW (ref 0.3–1.2)
Total Protein: 7 g/dL (ref 6.5–8.1)

## 2017-06-04 LAB — CBC
HCT: 39.8 % (ref 35.0–47.0)
Hemoglobin: 13.3 g/dL (ref 12.0–16.0)
MCH: 28.3 pg (ref 26.0–34.0)
MCHC: 33.4 g/dL (ref 32.0–36.0)
MCV: 84.8 fL (ref 80.0–100.0)
PLATELETS: 251 10*3/uL (ref 150–440)
RBC: 4.69 MIL/uL (ref 3.80–5.20)
RDW: 15.8 % — AB (ref 11.5–14.5)
WBC: 5.7 10*3/uL (ref 3.6–11.0)

## 2017-06-04 MED ORDER — HYDROXYZINE PAMOATE 25 MG PO CAPS: 25.0000 mg | ORAL_CAPSULE | Freq: Four times a day (QID) | ORAL | 0 refills | Status: DC | PRN

## 2017-06-04 MED ORDER — PREDNISONE 10 MG PO TABS: 10.0000 mg | ORAL_TABLET | Freq: Every day | ORAL | 0 refills | Status: DC

## 2017-06-04 NOTE — ED Triage Notes (Signed)
Pt to ED via POV c/o itching. Pt states that her symptoms started 2 weeks ago when she moved into a new house. Pt states that she has not noticed any bites. Pt in NAD at this time.

## 2017-06-04 NOTE — ED Provider Notes (Signed)
[facility] EMERGENCY DEPARTMENT Provider Note   CSN: [id number] Arrival date & time: 06/04/17  1449     History   Chief Complaint Chief Complaint  Patient presents with  . Pruritis    HPI Brandi Swanson is a 42 y.o. female.  Presents to the emergency department for evaluation of pruritus.  Patient states she has had at least 2 weeks of generalized itching throughout her entire body that is worse at nighttime.  She denies any rash, bites.   she states she has had exterminator come out the check for bugs that could be causing the itching but they are unable to find any.  She tried Benadryl with mild improvement but has not had this in a couple of days.  She has tried alcohol baths as well as oatmeal baths.  She denies any new foods, medications, detergents, soaps.  She denies any chest pain, shortness of breath.  She has a history of hypertension, denies any history of kidney disease.  HPI  Past Medical History:  Diagnosis Date  . Hypertension   . Migraine with aura 08/08/2015  . Tarsal tunnel syndrome   . Tarsal tunnel syndrome of left side 08/08/2015  . Tobacco abuse 08/08/2015    Patient Active Problem List   Diagnosis Date Noted  . Migraine headache with aura 09/05/2015  . Foot pain, left 08/08/2015  . Tarsal tunnel syndrome of left side 08/08/2015  . Foot callus 08/08/2015  . Tobacco abuse 08/08/2015  . Migraine with aura 08/08/2015  . COPD (chronic obstructive pulmonary disease) (HCC) 07/04/2015  . Seasonal allergies 07/04/2015  . Acid reflux 07/04/2015  . Hypertension 07/04/2015  . Depression 07/04/2015  . Anxiety 07/04/2015    Past Surgical History:  Procedure Laterality Date  . FOOT SURGERY Left    X's Three  . TUBAL LIGATION       OB History   None      Home Medications    Prior to Admission medications   Medication Sig Start Date End Date Taking? Authorizing Provider  albuterol (PROVENTIL HFA;VENTOLIN HFA) 108 (90 Base)  MCG/ACT inhaler Inhale 2 puffs into the lungs every 6 (six) hours as needed for wheezing or shortness of breath. 07/04/15   Michiel Cowboy A, PA-C  amitriptyline (ELAVIL) 50 MG tablet Take 1 tablet by mouth at bedtime. 12/08/16   Virl Axe, MD  bisoprolol-hydrochlorothiazide (ZIAC) 10-6.25 MG tablet Take 1 tablet by mouth daily. 02/27/16   Zachery Dauer, FNP  butalbital-acetaminophen-caffeine (FIORICET, ESGIC) [phone] MG tablet Take 1-2 tablets by mouth every 6 (six) hours as needed for headache. 03/16/17 03/16/18  Rebecka Apley, MD  cetirizine (ZYRTEC) 10 MG tablet TAKE ONE TABLET BY MOUTH EVERY DAY. REPLACES LORATADINE 09/11/16   Virl Axe, MD  cyclobenzaprine (FLEXERIL) 10 MG tablet TAKE ONE TABLET BY MOUTH 3 TIMES A DAY AS NEEDED FOR MUSCLE SPASMS 09/11/16   Virl Axe, MD  fluticasone (FLONASE) 50 MCG/ACT nasal spray Place 2 sprays into both nostrils daily. 04/23/16   Shane Crutch, MD  Fluticasone-Salmeterol (ADVAIR DISKUS) 250-50 MCG/DOSE AEPB Inhale 1 puff into the lungs 2 (two) times daily. 11/19/15   McGowan, Carollee Herter A, PA-C  gabapentin (NEURONTIN) 300 MG capsule TAKE ONE CAPSULE BY MOUTH 3 TIMES A DAY 12/08/16   Virl Axe, MD  hydrochlorothiazide (HYDRODIURIL) 25 MG tablet TAKE ONE TABLET BY MOUTH EVERY DAY 09/11/16   Virl Axe, MD  HYDROcodone-acetaminophen (NORCO) 5-325 MG per tablet Take 1 tablet  by mouth every 6 (six) hours as needed for moderate pain or severe pain. 07/02/14   Ruffian, III Kristine Garbe, PA-C  hydrOXYzine (VISTARIL) 25 MG capsule Take 1 capsule (25 mg total) by mouth every 6 (six) hours as needed for itching. 06/04/17   Evon Slack, PA-C  lactulose (CHRONULAC) 10 GM/15ML solution Take 15 mLs (10 g total) by mouth 3 (three) times daily. 02/27/16   Zachery Dauer, FNP  loratadine (CLARITIN) 10 MG tablet Take 1 tablet (10 mg total) by mouth daily. 02/27/16 02/28/17  Zachery Dauer, FNP  omeprazole (PRILOSEC) 20 MG capsule TAKE 2 CAPSULES (40MG ) BY  MOUTH EVERY DAY 06/10/16   Virl Axe, MD  predniSONE (DELTASONE) 10 MG tablet Take 1 tablet (10 mg total) by mouth daily. 6,5,4,3,2,1 six day taper 06/04/17   Evon Slack, PA-C  promethazine (PHENERGAN) 25 MG tablet TAKE ONE TABLET BY MOUTH EVERY 6 HOURS AS NEEDED FOR NAUSEA OR VOMNTING 09/11/16   Virl Axe, MD  propranolol (INDERAL) 40 MG tablet Take 1 tablet (40 mg total) by mouth 2 (two) times daily. 02/27/16   Zachery Dauer, FNP  propranolol (INDERAL) 40 MG tablet Take 1 tablet by mouth 2 times daily. 09/11/16   Virl Axe, MD    Family History Family History  Problem Relation Age of Onset  . COPD Mother   . Heart failure Mother   . Hypertension Mother   . Hypertension Father   . Heart disease Father   . Hypertension Sister   . Hypertension Brother     Social History Social History   Tobacco Use  . Smoking status: Current Every Day Smoker    Packs/day: 0.50    Years: 20.00    Pack years: 10.00    Types: Cigarettes  . Smokeless tobacco: Never Used  Substance Use Topics  . Alcohol use: No    Alcohol/week: 0.0 oz  . Drug use: No     Allergies   Ibuprofen   Review of Systems Review of Systems  Constitutional: Negative for fever.  Eyes: Negative for visual disturbance.  Respiratory: Negative for shortness of breath.   Cardiovascular: Negative for chest pain.  Gastrointestinal: Negative for nausea and vomiting.  Genitourinary: Negative for dysuria.  Skin: Negative for rash.  Neurological: Negative for dizziness and headaches.     Physical Exam Updated Vital Signs BP (!) 172/77 (BP Location: Left Arm)   Pulse 77   Temp 98.6 F (37 C) (Oral)   Resp 16   Ht 5\' 5"  (1.651 m)   Wt 89.8 kg (198 lb)   SpO2 98%   BMI 32.95 kg/m   Physical Exam  Constitutional: She is oriented to person, place, and time. She appears well-developed and well-nourished.  HENT:  Head: Normocephalic and atraumatic.  Eyes: Conjunctivae are normal.  Neck: Normal range  of motion.  Cardiovascular: Normal rate.  Pulmonary/Chest: Effort normal. No respiratory distress.  Musculoskeletal: Normal range of motion.  Neurological: She is alert and oriented to person, place, and time.  Skin: Skin is warm. No rash noted.  Patient without any type of rash throughout her body.  Skin is clear, dry.  No warmth or erythema.  Psychiatric: She has a normal mood and affect. Her behavior is normal. Thought content normal.     ED Treatments / Results  Labs (all labs ordered are listed, but only abnormal results are displayed) Labs Reviewed  CBC - Abnormal; Notable for the following components:  Result Value   RDW 15.8 (*)    All other components within normal limits  COMPREHENSIVE METABOLIC PANEL - Abnormal; Notable for the following components:   Potassium 3.4 (*)    Glucose, Bld 103 (*)    Calcium 8.8 (*)    Total Bilirubin 0.2 (*)    All other components within normal limits    EKG None  Radiology No results found.  Procedures Procedures (including critical care time)  Medications Ordered in ED Medications - No data to display   Initial Impression / Assessment and Plan / ED Course  I have reviewed the triage vital signs and the nursing notes.  Pertinent labs & imaging results that were available during my care of the patient were reviewed by me and considered in my medical decision making (see chart for details).     42 year old female with 2 weeks of pruritus.  No new soaps, detergents, foods, medications.  She has had her house checked for insects and bugs.  CBC and CMP were checked to rule out for any type of systemic disease such as renal failure liver disease.  CBC and CMP were normal.  Patient is started on a 6-day prednisone taper and after completing the taper will start Vistaril.  If no improvement she will follow-up PCP.  She is educated on signs symptoms return to the ED for.  Final Clinical Impressions(s) / ED Diagnoses   Final  diagnoses:  Pruritus    ED Discharge Orders        Ordered    predniSONE (DELTASONE) 10 MG tablet  Daily     06/04/17 1614    hydrOXYzine (VISTARIL) 25 MG capsule  Every 6 hours PRN     06/04/17 1614       Ronnette JuniperGaines, Giovannina Mun C, PA-C 06/04/17 1620    Willy Eddyobinson, Patrick, MD 06/04/17 [phone]

## 2017-06-04 NOTE — Discharge Instructions (Addendum)
Please take medications as prescribed.  Return to the ER for any worsening symptoms or urgent changes in your health.  Please follow-up with primary care provider in 1 week if no improvement.

## 2017-06-23 ENCOUNTER — Other Ambulatory Visit: Payer: Self-pay | Admitting: Internal Medicine

## 2017-06-23 ENCOUNTER — Telehealth: Payer: Self-pay | Admitting: Internal Medicine

## 2017-06-23 ENCOUNTER — Encounter: Payer: Self-pay | Admitting: Internal Medicine

## 2017-06-23 NOTE — Telephone Encounter (Signed)
3 attempts to schedule fu appt from recall list.   Deleting recall.  °Mailed Letter  °

## 2017-08-11 ENCOUNTER — Ambulatory Visit: Payer: Self-pay

## 2017-08-12 ENCOUNTER — Encounter: Payer: Self-pay | Admitting: Adult Health

## 2017-08-12 ENCOUNTER — Ambulatory Visit: Payer: Self-pay | Admitting: Adult Health

## 2017-08-12 VITALS — BP 142/92 | HR 82 | Temp 97.9°F | Ht 64.0 in | Wt 201.6 lb

## 2017-08-12 DIAGNOSIS — F329 Major depressive disorder, single episode, unspecified: Secondary | ICD-10-CM

## 2017-08-12 DIAGNOSIS — M79672 Pain in left foot: Secondary | ICD-10-CM

## 2017-08-12 DIAGNOSIS — I10 Essential (primary) hypertension: Secondary | ICD-10-CM

## 2017-08-12 DIAGNOSIS — Z720 Tobacco use: Secondary | ICD-10-CM

## 2017-08-12 DIAGNOSIS — G43109 Migraine with aura, not intractable, without status migrainosus: Secondary | ICD-10-CM

## 2017-08-12 DIAGNOSIS — K219 Gastro-esophageal reflux disease without esophagitis: Secondary | ICD-10-CM

## 2017-08-12 DIAGNOSIS — F32A Depression, unspecified: Secondary | ICD-10-CM

## 2017-08-12 DIAGNOSIS — F419 Anxiety disorder, unspecified: Secondary | ICD-10-CM

## 2017-08-12 DIAGNOSIS — J302 Other seasonal allergic rhinitis: Secondary | ICD-10-CM

## 2017-08-12 DIAGNOSIS — J449 Chronic obstructive pulmonary disease, unspecified: Secondary | ICD-10-CM

## 2017-08-12 MED ORDER — LACTULOSE 10 GM/15ML PO SOLN: 10.0000 g | Freq: Two times a day (BID) | ORAL | 3 refills | Status: DC | PRN

## 2017-08-12 MED ORDER — FLUTICASONE PROPIONATE 50 MCG/ACT NA SUSP: 2.0000 | Freq: Every day | NASAL | 2 refills | Status: DC

## 2017-08-12 MED ORDER — PROPRANOLOL HCL 40 MG PO TABS: 40.0000 mg | ORAL_TABLET | Freq: Two times a day (BID) | ORAL | 3 refills | Status: DC

## 2017-08-12 MED ORDER — OMEPRAZOLE 20 MG PO CPDR: 20.0000 mg | DELAYED_RELEASE_CAPSULE | Freq: Two times a day (BID) | ORAL | 3 refills | Status: DC

## 2017-08-12 MED ORDER — PROMETHAZINE HCL 25 MG PO TABS: 25.0000 mg | ORAL_TABLET | Freq: Three times a day (TID) | ORAL | 2 refills | Status: DC | PRN

## 2017-08-12 MED ORDER — FLUTICASONE-SALMETEROL 250-50 MCG/DOSE IN AEPB
1.0000 | INHALATION_SPRAY | Freq: Two times a day (BID) | RESPIRATORY_TRACT | 3 refills | Status: DC
Start: 2017-08-12 — End: 2022-09-19

## 2017-08-12 MED ORDER — GABAPENTIN 300 MG PO CAPS: 300.0000 mg | ORAL_CAPSULE | Freq: Three times a day (TID) | ORAL | 3 refills | Status: DC

## 2017-08-12 MED ORDER — AMITRIPTYLINE HCL 50 MG PO TABS: 50.0000 mg | ORAL_TABLET | Freq: Every day | ORAL | 3 refills | Status: AC

## 2017-08-12 MED ORDER — ASPIRIN-ACETAMINOPHEN-CAFFEINE 250-250-65 MG PO TABS: 1.0000 | ORAL_TABLET | Freq: Four times a day (QID) | ORAL | 2 refills | Status: DC | PRN

## 2017-08-12 MED ORDER — HYDROCHLOROTHIAZIDE 25 MG PO TABS: 25.0000 mg | ORAL_TABLET | Freq: Every day | ORAL | 3 refills | Status: DC

## 2017-08-12 MED ORDER — LORATADINE 10 MG PO TABS: 10.0000 mg | ORAL_TABLET | Freq: Every day | ORAL | 2 refills | Status: DC

## 2017-08-12 MED ORDER — ALBUTEROL SULFATE HFA 108 (90 BASE) MCG/ACT IN AERS: 2.0000 | INHALATION_SPRAY | Freq: Four times a day (QID) | RESPIRATORY_TRACT | 3 refills | Status: DC | PRN

## 2017-08-12 NOTE — Progress Notes (Signed)
Patient: Brandi Swanson Female    DOB: 10-Dec-1975   42 y.o.   MRN: [id number] Visit Date: 08/12/2017  Today's Provider: Shawn Route, NP   Chief Complaint  Patient presents with   Medication Refill   Subjective:    HPI 42 Y/O female presenting for f/u COPD and migraines. She reports a headache which she rates as 7/10, mostly localized in the frontal area, associated with photophobia. Denies dizziness, nausea and vomiting. She ran out of her blood pressure and COPD medications. She reports using her rescue inhaler at least five times a day. She denies chest pain, palpitations, edema and dyspnea. She however reports exertional dyspnea.  She continues to smoke but has been making efforts to quit. She now smokes 15 cigarettes per day, down from 2 packs per day  Allergies  Allergen Reactions   Ibuprofen Swelling   Previous Medications   ALBUTEROL (PROVENTIL HFA;VENTOLIN HFA) 108 (90 BASE) MCG/ACT INHALER    Inhale 2 puffs into the lungs every 6 (six) hours as needed for wheezing or shortness of breath.   AMITRIPTYLINE (ELAVIL) 50 MG TABLET    Take 1 tablet by mouth at bedtime.   BISOPROLOL-HYDROCHLOROTHIAZIDE (ZIAC) 10-6.25 MG TABLET    Take 1 tablet by mouth daily.   BUTALBITAL-ACETAMINOPHEN-CAFFEINE (FIORICET, ESGIC) 50-325-40 MG TABLET    Take 1-2 tablets by mouth every 6 (six) hours as needed for headache.   CETIRIZINE (ZYRTEC) 10 MG TABLET    TAKE ONE TABLET BY MOUTH EVERY DAY. REPLACES LORATADINE   CYCLOBENZAPRINE (FLEXERIL) 10 MG TABLET    TAKE ONE TABLET BY MOUTH 3 TIMES A DAY AS NEEDED FOR MUSCLE SPASMS   FLUTICASONE (FLONASE) 50 MCG/ACT NASAL SPRAY    Place 2 sprays into both nostrils daily.   FLUTICASONE-SALMETEROL (ADVAIR DISKUS) 250-50 MCG/DOSE AEPB    Inhale 1 puff into the lungs 2 (two) times daily.   GABAPENTIN (NEURONTIN) 300 MG CAPSULE    TAKE ONE CAPSULE BY MOUTH 3 TIMES A DAY   HYDROCHLOROTHIAZIDE (HYDRODIURIL) 25 MG TABLET    TAKE ONE TABLET BY MOUTH EVERY  DAY   HYDROCODONE-ACETAMINOPHEN (NORCO) 5-325 MG PER TABLET    Take 1 tablet by mouth every 6 (six) hours as needed for moderate pain or severe pain.   HYDROXYZINE (VISTARIL) 25 MG CAPSULE    Take 1 capsule (25 mg total) by mouth every 6 (six) hours as needed for itching.   LACTULOSE (CHRONULAC) 10 GM/15ML SOLUTION    Take 15 mLs (10 g total) by mouth 3 (three) times daily.   LORATADINE (CLARITIN) 10 MG TABLET    Take 1 tablet (10 mg total) by mouth daily.   OMEPRAZOLE (PRILOSEC) 20 MG CAPSULE    TAKE 2 CAPSULES (40MG ) BY MOUTH EVERY DAY   PREDNISONE (DELTASONE) 10 MG TABLET    Take 1 tablet (10 mg total) by mouth daily. 6,5,4,3,2,1 six day taper   PROMETHAZINE (PHENERGAN) 25 MG TABLET    TAKE ONE TABLET BY MOUTH EVERY 6 HOURS AS NEEDED FOR NAUSEA OR VOMNTING   PROPRANOLOL (INDERAL) 40 MG TABLET    Take 1 tablet (40 mg total) by mouth 2 (two) times daily.   PROPRANOLOL (INDERAL) 40 MG TABLET    Take 1 tablet by mouth 2 times daily.    Review of Systems  Constitutional: Negative.   HENT: Negative.   Eyes: Negative.   Respiratory: Positive for shortness of breath. Negative for wheezing.   Gastrointestinal: Positive for constipation. Negative for nausea and vomiting.  Endocrine: Negative.   Genitourinary: Negative.   Musculoskeletal: Positive for arthralgias and joint swelling (left ankle swelling).  Skin: Negative for rash.       itching  Neurological: Positive for headaches. Negative for dizziness.  Psychiatric/Behavioral: Negative.     Social History   Tobacco Use   Smoking status: Current Every Day Smoker    Packs/day: 0.50    Years: 20.00    Pack years: 10.00    Types: Cigarettes   Smokeless tobacco: Never Used  Substance Use Topics   Alcohol use: No    Alcohol/week: 0.0 oz   Objective:   BP (!) 142/92 (BP Location: Left Arm, Patient Position: Sitting)    Pulse 82    Temp 97.9 F (36.6 C) (Oral)    Ht 5\' 4"  (1.626 m)    Wt 201 lb 9.6 oz (91.4 kg)    LMP 07/10/2017  (Exact Date)    BMI 34.60 kg/m   Physical Exam  Constitutional: She is oriented to person, place, and time. She appears well-developed and well-nourished.  HENT:  Head: Normocephalic and atraumatic.  Eyes: Pupils are equal, round, and reactive to light. Conjunctivae and EOM are normal.  Neck: Normal range of motion. Neck supple.  Cardiovascular: Normal rate, regular rhythm, normal heart sounds and intact distal pulses.  Pulmonary/Chest: Effort normal and breath sounds normal. No respiratory distress.  Breath sounds diminished in the bases  Abdominal: Soft. Bowel sounds are normal.  Musculoskeletal: Normal range of motion.  Neurological: She is alert and oriented to person, place, and time.  Skin: Skin is warm and dry.  Psychiatric: She has a normal mood and affect.  Nursing note and vitals reviewed.       Assessment & Plan:  1. Migraine with aura and without status migrainosus, not intractable Start Excedrin migraine tid prn , elavil and neurontin. Patient advised to avoid triggers and to stay hydrated  2. Chronic obstructive pulmonary disease, unspecified COPD type (HCC) Well controlled. Continue current medications  3. Tobacco abuse Smoking cessation advice given  4. Depression, unspecified depression type Continue amitriptyline (ELAVIL) 50 MG tablet; Take 1 tablet (50 mg total) by mouth at bedtime.    5. Gastroesophageal reflux disease without esophagitis Continue omeprazole  6. Seasonal allergies Continue claritin  7. Foot pain, left PRN Tylenol and neurontin  8. Anxiety Stable. Continue current medictions  9. Essential hypertension - HgB A1c - Lipid Profile - TSH Continue HCTZ and propranolol. Monitor BP daily and if SBP consistently >15040mmhg, RTC  Magddalene Rolan BuccoS Tukov-Yual, NP   [facility] [place] [facility]

## 2017-08-13 ENCOUNTER — Other Ambulatory Visit: Payer: Self-pay | Admitting: Internal Medicine

## 2017-08-13 LAB — HEMOGLOBIN A1C
Est. average glucose Bld gHb Est-mCnc: 120 mg/dL
HEMOGLOBIN A1C: 5.8 % — AB (ref 4.8–5.6)

## 2017-08-13 LAB — LIPID PANEL
CHOL/HDL RATIO: 3.7 ratio (ref 0.0–4.4)
Cholesterol, Total: 150 mg/dL (ref 100–199)
HDL: 41 mg/dL (ref >39)
LDL CALC: 83 mg/dL (ref 0–99)
Triglycerides: 130 mg/dL (ref 0–149)
VLDL Cholesterol Cal: 26 mg/dL (ref 5–40)

## 2017-08-13 LAB — TSH: TSH: 0.407 u[IU]/mL — AB (ref 0.450–4.500)

## 2017-08-24 ENCOUNTER — Telehealth: Payer: Self-pay | Admitting: Pharmacist

## 2017-08-24 NOTE — Telephone Encounter (Signed)
08/24/2017 8:55:08 AM - Refills-Advair 250/50 & Ventolin HFA  08/24/17 Placed refill online with [facility] for Advair 250/50 & Ventolin HFA-to release 10/14/17, order# [id number].Brandi RadonAJ

## 2017-11-18 ENCOUNTER — Ambulatory Visit: Payer: Self-pay | Admitting: Adult Health

## 2017-11-25 ENCOUNTER — Telehealth: Payer: Self-pay | Admitting: Pharmacist

## 2017-11-25 NOTE — Telephone Encounter (Signed)
11/25/2017 11:51:19 AM - refill [facility]-Advair & Ventolin  11/25/17 Placed refill online with [facility] for Advair 250/50 & Ventolin HFA to release 12/09/17 Order# [id number].Brandi Swanson

## 2018-01-11 ENCOUNTER — Other Ambulatory Visit: Payer: Self-pay | Admitting: Internal Medicine

## 2018-02-28 ENCOUNTER — Telehealth: Payer: Self-pay | Admitting: Pharmacist

## 2018-02-28 NOTE — Telephone Encounter (Signed)
02/28/2018 10:43:30 AM - Advair & Ventolin Pending  02/28/2018 I have received the signed script back from provider for Ventolin & Advair - Holding for patient to return her portion of application, this was mailed to patient 02/15/2018.Forde Radon

## 2018-03-14 DIAGNOSIS — K219 Gastro-esophageal reflux disease without esophagitis: Secondary | ICD-10-CM | POA: Diagnosis present

## 2018-03-14 DIAGNOSIS — Z9851 Tubal ligation status: Secondary | ICD-10-CM

## 2018-03-14 DIAGNOSIS — I10 Essential (primary) hypertension: Secondary | ICD-10-CM | POA: Diagnosis present

## 2018-03-14 DIAGNOSIS — Z720 Tobacco use: Secondary | ICD-10-CM

## 2018-03-14 DIAGNOSIS — Z6841 Body Mass Index (BMI) 40.0 and over, adult: Secondary | ICD-10-CM

## 2018-03-14 DIAGNOSIS — K828 Other specified diseases of gallbladder: Secondary | ICD-10-CM | POA: Diagnosis present

## 2018-03-14 DIAGNOSIS — K8000 Calculus of gallbladder with acute cholecystitis without obstruction: Principal | ICD-10-CM | POA: Diagnosis present

## 2018-03-14 DIAGNOSIS — J45909 Unspecified asthma, uncomplicated: Secondary | ICD-10-CM | POA: Diagnosis present

## 2018-03-14 DIAGNOSIS — N800 Endometriosis of uterus: Secondary | ICD-10-CM | POA: Diagnosis present

## 2018-03-15 ENCOUNTER — Inpatient Hospital Stay
Admission: EM | Admit: 2018-03-15 | Discharge: 2018-03-17 | DRG: 418 | Disposition: A | Discharge status: Home / Self Care | Payer: Self-pay | Attending: Surgery | Admitting: Surgery

## 2018-03-15 ENCOUNTER — Encounter: Payer: Self-pay | Admitting: Emergency Medicine

## 2018-03-15 ENCOUNTER — Emergency Department: Payer: Self-pay

## 2018-03-15 ENCOUNTER — Other Ambulatory Visit: Payer: Self-pay

## 2018-03-15 DIAGNOSIS — K8000 Calculus of gallbladder with acute cholecystitis without obstruction: Secondary | ICD-10-CM

## 2018-03-15 HISTORY — DX: Calculus of gallbladder with acute cholecystitis without obstruction: K80.00

## 2018-03-15 LAB — CBC WITH DIFFERENTIAL/PLATELET
Abs Immature Granulocytes: 0.02 10*3/uL (ref 0.00–0.07)
Basophils Absolute: 0.1 10*3/uL (ref 0.0–0.1)
Basophils Relative: 1 %
Eosinophils Absolute: 0.2 10*3/uL (ref 0.0–0.5)
Eosinophils Relative: 2 %
HCT: 44.1 % (ref 36.0–46.0)
Hemoglobin: 14.5 g/dL (ref 12.0–15.0)
Immature Granulocytes: 0 %
Lymphocytes Relative: 27 %
Lymphs Abs: 1.9 10*3/uL (ref 0.7–4.0)
MCH: 27.9 pg (ref 26.0–34.0)
MCHC: 32.9 g/dL (ref 30.0–36.0)
MCV: 85 fL (ref 80.0–100.0)
MONO ABS: 0.7 10*3/uL (ref 0.1–1.0)
Monocytes Relative: 10 %
Neutro Abs: 4.1 10*3/uL (ref 1.7–7.7)
Neutrophils Relative %: 60 %
Platelets: 257 10*3/uL (ref 150–400)
RBC: 5.19 MIL/uL — ABNORMAL HIGH (ref 3.87–5.11)
RDW: 14.5 % (ref 11.5–15.5)
WBC: 7 10*3/uL (ref 4.0–10.5)
nRBC: 0 % (ref 0.0–0.2)

## 2018-03-15 LAB — URINALYSIS, COMPLETE (UACMP) WITH MICROSCOPIC
BACTERIA UA: NONE SEEN
Bilirubin Urine: NEGATIVE
Glucose, UA: NEGATIVE mg/dL
Hgb urine dipstick: NEGATIVE
Ketones, ur: NEGATIVE mg/dL
Leukocytes, UA: NEGATIVE
Nitrite: NEGATIVE
PH: 5 (ref 5.0–8.0)
Protein, ur: NEGATIVE mg/dL
Specific Gravity, Urine: 1.017 (ref 1.005–1.030)

## 2018-03-15 LAB — COMPREHENSIVE METABOLIC PANEL
ALT: 19 U/L (ref 0–44)
AST: 20 U/L (ref 15–41)
Albumin: 4.4 g/dL (ref 3.5–5.0)
Alkaline Phosphatase: 66 U/L (ref 38–126)
Anion gap: 6 (ref 5–15)
BUN: 10 mg/dL (ref 6–20)
CO2: 21 mmol/L — ABNORMAL LOW (ref 22–32)
Calcium: 9.1 mg/dL (ref 8.9–10.3)
Chloride: 109 mmol/L (ref 98–111)
Creatinine, Ser: 0.84 mg/dL (ref 0.44–1.00)
GFR calc Af Amer: >60 mL/min (ref >60)
Glucose, Bld: 137 mg/dL — ABNORMAL HIGH (ref 70–99)
Potassium: 3.9 mmol/L (ref 3.5–5.1)
SODIUM: 136 mmol/L (ref 135–145)
Total Bilirubin: 0.3 mg/dL (ref 0.3–1.2)
Total Protein: 7.6 g/dL (ref 6.5–8.1)

## 2018-03-15 LAB — TROPONIN I

## 2018-03-15 LAB — POCT PREGNANCY, URINE: Preg Test, Ur: NEGATIVE

## 2018-03-15 LAB — LIPASE, BLOOD: LIPASE: 29 U/L (ref 11–51)

## 2018-03-15 MED ORDER — ACETAMINOPHEN 325 MG PO TABS
650.0000 mg | ORAL_TABLET | Freq: Four times a day (QID) | ORAL | Status: DC | PRN
  Administered 2018-03-15 – 2018-03-16 (×4): 650 mg via ORAL
  Filled 2018-03-15 (×5): qty 2

## 2018-03-15 MED ORDER — PROPRANOLOL HCL 40 MG PO TABS
40.0000 mg | ORAL_TABLET | Freq: Two times a day (BID) | ORAL | Status: DC
  Administered 2018-03-15 – 2018-03-16 (×4): 40 mg via ORAL
  Filled 2018-03-15 (×7): qty 1

## 2018-03-15 MED ORDER — MOMETASONE FURO-FORMOTEROL FUM 200-5 MCG/ACT IN AERO
2.0000 | INHALATION_SPRAY | Freq: Two times a day (BID) | RESPIRATORY_TRACT | Status: DC
  Administered 2018-03-15 – 2018-03-17 (×5): 2 via RESPIRATORY_TRACT
  Filled 2018-03-15 (×2): qty 8.8

## 2018-03-15 MED ORDER — ACETAMINOPHEN 500 MG PO TABS: 1000.0000 mg | ORAL_TABLET | ORAL | Status: DC

## 2018-03-15 MED ORDER — ACETAMINOPHEN 650 MG RE SUPP: 650.0000 mg | Freq: Four times a day (QID) | RECTAL | Status: DC | PRN

## 2018-03-15 MED ORDER — ONDANSETRON 4 MG PO TBDP: 4.0000 mg | ORAL_TABLET | Freq: Four times a day (QID) | ORAL | Status: DC | PRN

## 2018-03-15 MED ORDER — HYDRALAZINE HCL 20 MG/ML IJ SOLN
10.0000 mg | Freq: Four times a day (QID) | INTRAMUSCULAR | Status: DC | PRN
  Administered 2018-03-16: 10 mg via INTRAVENOUS
  Filled 2018-03-15: qty 1

## 2018-03-15 MED ORDER — TRAMADOL HCL 50 MG PO TABS
50.0000 mg | ORAL_TABLET | Freq: Four times a day (QID) | ORAL | Status: DC | PRN
  Administered 2018-03-15 – 2018-03-16 (×4): 50 mg via ORAL
  Filled 2018-03-15 (×4): qty 1

## 2018-03-15 MED ORDER — ONDANSETRON HCL 4 MG/2ML IJ SOLN
4.0000 mg | Freq: Four times a day (QID) | INTRAMUSCULAR | Status: DC | PRN
  Administered 2018-03-15 – 2018-03-16 (×2): 4 mg via INTRAVENOUS
  Filled 2018-03-15 (×2): qty 2

## 2018-03-15 MED ORDER — SODIUM CHLORIDE 0.9 % IV SOLN
Freq: Once | INTRAVENOUS | Status: AC
  Administered 2018-03-15: 06:00:00 via INTRAVENOUS

## 2018-03-15 MED ORDER — MORPHINE SULFATE (PF) 2 MG/ML IV SOLN
2.0000 mg | INTRAVENOUS | Status: DC | PRN
  Administered 2018-03-15 – 2018-03-16 (×8): 2 mg via INTRAVENOUS
  Filled 2018-03-15 (×8): qty 1

## 2018-03-15 MED ORDER — GABAPENTIN 300 MG PO CAPS
300.0000 mg | ORAL_CAPSULE | Freq: Three times a day (TID) | ORAL | Status: DC
  Administered 2018-03-15 – 2018-03-16 (×5): 300 mg via ORAL
  Filled 2018-03-15 (×6): qty 1

## 2018-03-15 MED ORDER — FENTANYL CITRATE (PF) 100 MCG/2ML IJ SOLN
50.0000 ug | INTRAMUSCULAR | Status: DC | PRN
  Administered 2018-03-15 (×2): 50 ug via INTRAVENOUS
  Filled 2018-03-15 (×2): qty 2

## 2018-03-15 MED ORDER — SODIUM CHLORIDE 0.9 % IV SOLN
2.0000 g | INTRAVENOUS | Status: DC
  Administered 2018-03-15 – 2018-03-17 (×3): 2 g via INTRAVENOUS
  Filled 2018-03-15: qty 2
  Filled 2018-03-15: qty 20
  Filled 2018-03-15: qty 2
  Filled 2018-03-15: qty 20

## 2018-03-15 MED ORDER — CHLORHEXIDINE GLUCONATE CLOTH 2 % EX PADS
6.0000 | MEDICATED_PAD | Freq: Once | CUTANEOUS | Status: AC
  Administered 2018-03-16: 6 via TOPICAL

## 2018-03-15 MED ORDER — DEXTROSE IN LACTATED RINGERS 5 % IV SOLN
INTRAVENOUS | Status: DC
  Administered 2018-03-15 – 2018-03-16 (×6): via INTRAVENOUS

## 2018-03-15 MED ORDER — INFLUENZA VAC SPLIT QUAD 0.5 ML IM SUSY: 0.5000 mL | PREFILLED_SYRINGE | INTRAMUSCULAR | Status: DC

## 2018-03-15 MED ORDER — ALBUTEROL SULFATE (2.5 MG/3ML) 0.083% IN NEBU: 2.5000 mg | INHALATION_SOLUTION | Freq: Four times a day (QID) | RESPIRATORY_TRACT | Status: DC | PRN

## 2018-03-15 MED ORDER — ONDANSETRON HCL 4 MG/2ML IJ SOLN
4.0000 mg | INTRAMUSCULAR | Status: AC
  Administered 2018-03-15: 4 mg via INTRAVENOUS
  Filled 2018-03-15: qty 2

## 2018-03-15 MED ORDER — PANTOPRAZOLE SODIUM 40 MG PO TBEC
40.0000 mg | DELAYED_RELEASE_TABLET | Freq: Every day | ORAL | Status: DC
  Administered 2018-03-15 – 2018-03-16 (×2): 40 mg via ORAL
  Filled 2018-03-15 (×2): qty 1

## 2018-03-15 MED ORDER — CHLORHEXIDINE GLUCONATE CLOTH 2 % EX PADS
6.0000 | MEDICATED_PAD | Freq: Once | CUTANEOUS | Status: AC
  Administered 2018-03-15: 6 via TOPICAL

## 2018-03-15 MED ORDER — MORPHINE SULFATE (PF) 4 MG/ML IV SOLN
4.0000 mg | Freq: Once | INTRAVENOUS | Status: AC
  Administered 2018-03-15: 4 mg via INTRAVENOUS
  Filled 2018-03-15: qty 1

## 2018-03-15 MED ORDER — HEPARIN SODIUM (PORCINE) 5000 UNIT/ML IJ SOLN
5000.0000 [IU] | Freq: Three times a day (TID) | INTRAMUSCULAR | Status: DC
  Administered 2018-03-15 – 2018-03-16 (×4): 5000 [IU] via SUBCUTANEOUS
  Filled 2018-03-15 (×5): qty 1

## 2018-03-15 NOTE — ED Notes (Addendum)
Pt reports non-radiating epigastric pain that started last night at 9pm. Pt reports (+) nausea, but denies vomiting. Pt states she took a dose of Tylenol PTA without relief. Pt reports h/x of acid reflux and has been taking Omeprazole as prescribed.

## 2018-03-15 NOTE — ED Triage Notes (Signed)
Patient ambulatory to triage with steady gait, without difficulty, teaful; pt reports mid epigastric pain x 4 hours with no accomp symptoms; denies hx of same

## 2018-03-15 NOTE — ED Provider Notes (Signed)
[facility] Emergency Department Provider Note  ____________________________________________   First MD Initiated Contact with Patient 03/15/18 0320     (approximate)  I have reviewed the triage vital signs and the nursing notes.   HISTORY  Chief Complaint Abdominal Pain    HPI Brandi Swanson is a 43 y.o. female with medical history as listed below who presents for evaluation of acute onset upper abdominal pain.  This occurred yesterday after she got home from work shortly after eating breakfast.  She reports that the pain is been constant since that time and nothing particular makes it better or worse.  She has not tried eating or drinking very much since it happened because she was afraid it would get worse.  She has had nausea but no vomiting and no diarrhea.  No lower abdominal pain.  The pain is mostly isolated to her epigastric region just below the rib cage although it does hurt on the right side sometimes as well.  She denies fever/chills, chest pain, shortness of breath.  She has not had similar episodes in the past.  She has had a tubal ligation but no other abdominal surgeries.  She is passing gas.  Past Medical History:  Diagnosis Date  . Hypertension   . Migraine with aura 08/08/2015  . Tarsal tunnel syndrome   . Tarsal tunnel syndrome of left side 08/08/2015  . Tobacco abuse 08/08/2015    Patient Active Problem List   Diagnosis Date Noted  . Migraine headache with aura 09/05/2015  . Foot pain, left 08/08/2015  . Tarsal tunnel syndrome of left side 08/08/2015  . Foot callus 08/08/2015  . Tobacco abuse 08/08/2015  . Migraine with aura 08/08/2015  . COPD (chronic obstructive pulmonary disease) (HCC) 07/04/2015  . Seasonal allergies 07/04/2015  . Acid reflux 07/04/2015  . Hypertension 07/04/2015  . Depression 07/04/2015  . Anxiety 07/04/2015    Past Surgical History:  Procedure Laterality Date  . FOOT SURGERY Left    X's Three  .  TUBAL LIGATION      Prior to Admission medications   Medication Sig Start Date End Date Taking? Authorizing Provider  albuterol (PROVENTIL HFA;VENTOLIN HFA) 108 (90 Base) MCG/ACT inhaler Inhale 2 puffs into the lungs every 6 (six) hours as needed for wheezing or shortness of breath. 08/12/17   Tukov-Yual, Alroy Bailiff, NP  amitriptyline (ELAVIL) 50 MG tablet Take 1 tablet (50 mg total) by mouth at bedtime. 08/12/17   Andreas Ohm, NP  aspirin-acetaminophen-caffeine (EXCEDRIN MIGRAINE) [phone] MG tablet Take 1 tablet by mouth every 6 (six) hours as needed for headache. 08/12/17   Tukov-Yual, Alroy Bailiff, NP  butalbital-acetaminophen-caffeine (FIORICET, ESGIC) 50-325-40 MG tablet Take 1-2 tablets by mouth every 6 (six) hours as needed for headache. 03/16/17 03/16/18  Rebecka Apley, MD  cetirizine (ZYRTEC) 10 MG tablet TAKE ONE TABLET BY MOUTH EVERY DAY. REPLACES LORATADINE Patient taking differently: Take 10 mg by mouth daily.  09/11/16   Virl Axe, MD  cyclobenzaprine (FLEXERIL) 10 MG tablet TAKE ONE TABLET BY MOUTH 3 TIMES A DAY AS NEEDED FOR MUSCLE SPASMS Patient taking differently: Take 10 mg by mouth 3 (three) times daily as needed for muscle spasms.  09/11/16   Virl Axe, MD  fluticasone (FLONASE) 50 MCG/ACT nasal spray Place 2 sprays into both nostrils daily. 08/12/17   Tukov-Yual, Alroy Bailiff, NP  Fluticasone-Salmeterol (ADVAIR DISKUS) 250-50 MCG/DOSE AEPB Inhale 1 puff into the lungs 2 (two) times daily. 08/12/17  Tukov-Yual, Magdalene S, NP  gabapentin (NEURONTIN) 300 MG capsule Take 1 capsule (300 mg total) by mouth 3 (three) times daily. 08/12/17   Tukov-Yual, Alroy BailiffMagdalene S, NP  hydrochlorothiazide (HYDRODIURIL) 25 MG tablet Take 1 tablet (25 mg total) by mouth daily. 08/12/17   Tukov-Yual, Alroy BailiffMagdalene S, NP  HYDROcodone-acetaminophen (NORCO) 5-325 MG per tablet Take 1 tablet by mouth every 6 (six) hours as needed for moderate pain or severe pain. 07/02/14   Ruffian, III  Kristine GarbeWilliam C, PA-C  hydrOXYzine (VISTARIL) 25 MG capsule Take 1 capsule (25 mg total) by mouth every 6 (six) hours as needed for itching. 06/04/17   Evon SlackGaines, Thomas C, PA-C  lactulose (CHRONULAC) 10 GM/15ML solution Take 15 mLs (10 g total) by mouth 2 (two) times daily as needed for mild constipation. 08/12/17   Tukov-Yual, Alroy BailiffMagdalene S, NP  loratadine (CLARITIN) 10 MG tablet Take 1 tablet (10 mg total) by mouth daily. 08/12/17 08/14/18  Tukov-Yual, Alroy BailiffMagdalene S, NP  omeprazole (PRILOSEC) 20 MG capsule Take 1 capsule (20 mg total) by mouth 2 (two) times daily before a meal. 08/12/17   Tukov-Yual, Alroy BailiffMagdalene S, NP  promethazine (PHENERGAN) 25 MG tablet Take 1 tablet (25 mg total) by mouth every 8 (eight) hours as needed for nausea or vomiting. 08/12/17   Tukov-Yual, Alroy BailiffMagdalene S, NP  propranolol (INDERAL) 40 MG tablet Take 1 tablet (40 mg total) by mouth 2 (two) times daily. 08/12/17   Andreas Ohmukov-Yual, Magdalene S, NP    Allergies Ibuprofen  Family History  Problem Relation Age of Onset  . COPD Mother   . Heart failure Mother   . Hypertension Mother   . Hypertension Father   . Heart disease Father   . Hypertension Sister   . Hypertension Brother     Social History Social History   Tobacco Use  . Smoking status: Current Every Day Smoker    Packs/day: 0.50    Years: 20.00    Pack years: 10.00    Types: Cigarettes  . Smokeless tobacco: Never Used  Substance Use Topics  . Alcohol use: No    Alcohol/week: 0.0 standard drinks  . Drug use: No    Review of Systems Constitutional: No fever/chills Eyes: No visual changes. ENT: No sore throat. Cardiovascular: Denies chest pain. Respiratory: Denies shortness of breath. Gastrointestinal: Abdominal pain as described above with nausea but no vomiting. Genitourinary: Negative for dysuria. Musculoskeletal: Negative for neck pain.  Negative for back pain. Integumentary: Negative for rash. Neurological: Negative for headaches, focal weakness or  numbness.   ____________________________________________   PHYSICAL EXAM:  VITAL SIGNS: ED Triage Vitals  Enc Vitals Group     BP 03/15/18 0004 (!) 199/91     Pulse Rate 03/15/18 0004 87     Resp 03/15/18 0004 18     Temp 03/15/18 0004 98.4 F (36.9 C)     Temp Source 03/15/18 0004 Oral     SpO2 03/15/18 0004 97 %     Weight 03/15/18 0003 135.2 kg (298 lb)     Height 03/15/18 0003 1.651 m (5\' 5" )     Head Circumference --      Peak Flow --      Pain Score 03/15/18 0003 10     Pain Loc --      Pain Edu? --      Excl. in GC? --     Constitutional: Alert and oriented.  Appears uncomfortable but is not in severe distress. Eyes: Conjunctivae are normal.  Head: Atraumatic. Nose: No  congestion/rhinnorhea. Mouth/Throat: Mucous membranes are moist. Neck: No stridor.  No meningeal signs.   Cardiovascular: Normal rate, regular rhythm. Good peripheral circulation. Grossly normal heart sounds. Respiratory: Normal respiratory effort.  No retractions. Lungs CTAB. Gastrointestinal: Soft and nondistended.  Moderate to severe tenderness to palpation of the epigastrium and right upper quadrant with equivocal Murphy sign.  No lower abdominal tenderness.  No rebound and no guarding. Musculoskeletal: No lower extremity tenderness nor edema. No gross deformities of extremities. Neurologic:  Normal speech and language. No gross focal neurologic deficits are appreciated.  Skin:  Skin is warm, dry and intact. No rash noted. Psychiatric: Mood and affect are normal. Speech and behavior are normal.  ____________________________________________   LABS (all labs ordered are listed, but only abnormal results are displayed)  Labs Reviewed  CBC WITH DIFFERENTIAL/PLATELET - Abnormal; Notable for the following components:      Result Value   RBC 5.19 (*)    All other components within normal limits  COMPREHENSIVE METABOLIC PANEL - Abnormal; Notable for the following components:   CO2 21 (*)     Glucose, Bld 137 (*)    All other components within normal limits  URINALYSIS, COMPLETE (UACMP) WITH MICROSCOPIC - Abnormal; Notable for the following components:   Color, Urine YELLOW (*)    APPearance CLEAR (*)    All other components within normal limits  LIPASE, BLOOD  TROPONIN I  POCT PREGNANCY, URINE   ____________________________________________  EKG  ED ECG REPORT I, Loleta Roseory Arzell Mcgeehan, the attending physician, personally viewed and interpreted this ECG.  Date: 03/15/2018 EKG Time: 00: 05 Rate: 81 Rhythm: normal sinus rhythm QRS Axis: normal Intervals: normal ST/T Wave abnormalities: normal Narrative Interpretation: no evidence of acute ischemia  ____________________________________________  RADIOLOGY   ED MD interpretation:  Acute cholecystitis  Official radiology report(s): [place] Abdomen Limited Ruq  Result Date: 03/15/2018 CLINICAL DATA:  Severe epigastric pain with nausea for 8 hours EXAM: ULTRASOUND ABDOMEN LIMITED RIGHT UPPER QUADRANT COMPARISON:  08/27/2008 FINDINGS: Gallbladder: Gallstone fixed in the gallbladder neck. The gallbladder wall is thickened and mildly striated. Ring down artifact consistent with adenomyomatosis. Trace pericholecystic fluid. Positive sonographic Murphy sign. 11 mm area of mid level echoes at the gallbladder fundus that does not appear to have a stalk, most consistent with sludge. Color Doppler was not applied. Common bile duct: Diameter: 4 mm.  Where visualized, no filling defect. Liver: No focal lesion identified. Within normal limits in parenchymal echogenicity. Portal vein is patent on color Doppler imaging with normal direction of blood flow towards the liver. IMPRESSION: 1. Findings of acute calculus cholecystitis. 2. 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 Electronically Signed   By: Marnee SpringJonathon  Watts M.D.   On: 03/15/2018 04:58    ____________________________________________   PROCEDURES  Critical Care performed:  No   Procedure(s) performed:   Procedures   ____________________________________________   INITIAL IMPRESSION / ASSESSMENT AND PLAN / ED COURSE  As part of my medical decision making, I reviewed the following data within the electronic MEDICAL RECORD NUMBER Nursing notes reviewed and incorporated, Labs reviewed , Old chart reviewed, Notes from prior ED visits and [place] Controlled Substance Database.    Differential diagnosis includes, but is not limited to, biliary colic/gallbladder disease, pancreatitis, SBO/ileus, ulcer, less likely diverticulitis or ACS.  Patient's vital signs are stable.  She has not had any infectious symptoms such as fever.  Given the description of the symptoms and their duration, I strongly suspect these symptoms represent biliary colic with no evidence of cholecystitis.  Her vital signs are stable and she has a normal comprehensive metabolic panel including no LFT elevation and a normal lipase.  Troponin is negative, urinalysis is unremarkable, and she has a normal CBC with no leukocytosis.  I do not anticipate that the ultrasound will demonstrate cholecystitis, but I will obtain an ultrasound to look for signs of a gallstone in the neck of the gallbladder which I think is most likely.  If the ultrasound is completely normal I will consider obtaining a CT scan, but I think that any of the other items on the differential are much less likely than biliary colic.  Unfortunately she has a history of angioedema with ibuprofen so I will not give her Toradol, but I will give morphine 4 mg IV and Zofran 4 mg IV and then reassess.  Clinical Course as of Mar 15 524  Tue Mar 15, 2018  0981 Acute cholecystitis with stone in gallbladder neck.  Pain better controlled at this time, no acute distress.  Discussed case now with Dr. Earlene Plater who will admit.  Patient updated.  US ABDOMEN LIMITED RUQ [CF]    Clinical Course User Index [CF] Loleta Rose, MD     ____________________________________________  FINAL CLINICAL IMPRESSION(S) / ED DIAGNOSES  Final diagnoses:  Acute cholecystitis due to biliary calculus     MEDICATIONS GIVEN DURING THIS VISIT:  Medications  0.9 %  sodium chloride infusion (has no administration in time range)  morphine 4 MG/ML injection 4 mg (4 mg Intravenous Given 03/15/18 0340)  ondansetron (ZOFRAN) injection 4 mg (4 mg Intravenous Given 03/15/18 0340)     ED Discharge Orders    None       Note:  This document was prepared using Dragon voice recognition software and may include unintentional dictation errors.   Loleta Rose, MD 03/15/18 [phone]

## 2018-03-15 NOTE — Progress Notes (Signed)
Patient discussed with Dr. York Cerise, including that patient's pain is currently well-controlled vs has much improved. Patient's ultrasound results, indicating acute cholecystitis, and recent chart + labs were reviewed. Will admit to surgical service with NPO, pain control prn, IV fluids, and anticipate cholecystectomy pending anesthesia and OR availability.  Full H&P to follow. Please call if any questions or concerns.  -- Scherrie Gerlach Earlene Plater, MD, RPVI [place]: [facility] General Surgery - Partnering for exceptional care. Office: [phone]

## 2018-03-15 NOTE — H&P (Addendum)
SURGICAL HISTORY & PHYSICAL (cpt [phone])  Patient seen and examined as described below with surgical PA-C, Gillermina Phy.  Assessment/Plan: (ICD-10's: K81.64) 43 year old female with controlled RUQ abdominal pain attributable to acute calculus cholecystitis, complicated by comorbidities including obesity (BMI >45), HTN, migraine headaches with aura, and current tobacco abuse (smoking).    - clear liquids okay today  - continue IV ABx (Rocephin)  - NPO + IV fluids after midnight  - pain control as needed, antiemetics as needed  - continue to monitor abdominal examination and on-going bowel function  - all risks, benefits, and alternatives to above procedure were discussed with the patient, and all of her questions were answered to her expressed satisfaction, patient expresses she wishes to proceed, and informed consent was obtained.  - will plan for laparoscopic cholecystectomy tomorrow afternoon pending anesthesia and OR availability  - medical management of comorbidities   - DVT prophylaxis, ambulation  I have personally reviewed the patient's chart, evaluated/examined the patient, proposed the recommended management, and discussed these recommendations with the patientto her expressed satisfaction as well as with patient's RN and ED physician.  -- Scherrie Gerlach Earlene Plater, MD, RPVI : [facility] General Surgery - Partnering for exceptional care. Office: [phone]  [facility] SURGICAL HISTORY & PHYSICAL (cpt [phone])  HISTORY OF PRESENT ILLNESS (HPI):  43 y.o. female presented to [facility] ED today for abdominal pain. Patient reports the acute onset of RUQ abdominal pain after arriving home yesterday evening. This was shortly after breakfast. The pain is constant in nature and is alleviated by nothing. She does endorse associated nausea. She denied any fever, chills, CP, SOB, emesis, or bladder/bowel changes. Never had a history of similar pain  in the past. No history of similar pain in the past. Previous abdominal surgeries are only positive for tubal ligation. Work up in the ED was concerning for acute cholecystitis.   General surgery is consulted by emergency medicine physician Dr Loleta Rose, MD for evaluation and management of acute cholecystitis.   PAST MEDICAL HISTORY (PMH):  Past Medical History:  Diagnosis Date  . Hypertension   . Migraine with aura 08/08/2015  . Tarsal tunnel syndrome   . Tarsal tunnel syndrome of left side 08/08/2015  . Tobacco abuse 08/08/2015    Reviewed. Otherwise negative.   PAST SURGICAL HISTORY (PSH):  Past Surgical History:  Procedure Laterality Date  . FOOT SURGERY Left    X's Three  . TUBAL LIGATION      Reviewed. Otherwise negative.   MEDICATIONS:  Prior to Admission medications   Medication Sig Start Date End Date Taking? Authorizing Provider  amitriptyline (ELAVIL) 50 MG tablet Take 1 tablet (50 mg total) by mouth at bedtime. 08/12/17  Yes Tukov-Yual, Alroy Bailiff, NP  cetirizine (ZYRTEC) 10 MG tablet TAKE ONE TABLET BY MOUTH EVERY DAY. REPLACES LORATADINE Patient taking differently: Take 10 mg by mouth daily.  09/11/16  Yes Virl Axe, MD  Fluticasone-Salmeterol (ADVAIR DISKUS) 250-50 MCG/DOSE AEPB Inhale 1 puff into the lungs 2 (two) times daily. 08/12/17  Yes Tukov-Yual, Magdalene S, NP  gabapentin (NEURONTIN) 300 MG capsule Take 1 capsule (300 mg total) by mouth 3 (three) times daily. 08/12/17  Yes Tukov-Yual, Alroy Bailiff, NP  hydrochlorothiazide (HYDRODIURIL) 25 MG tablet Take 1 tablet (25 mg total) by mouth daily. 08/12/17  Yes Tukov-Yual, Alroy Bailiff, NP  omeprazole (PRILOSEC) 20 MG capsule Take 1 capsule (20 mg total) by mouth 2 (two) times daily before a meal. 08/12/17  Yes Tukov-Yual, Magdalene S, NP  propranolol (INDERAL) 40 MG tablet Take 1 tablet (40 mg total) by mouth 2 (two) times daily. 08/12/17  Yes Tukov-Yual, Alroy BailiffMagdalene S, NP  albuterol (PROVENTIL HFA;VENTOLIN HFA) 108  (90 Base) MCG/ACT inhaler Inhale 2 puffs into the lungs every 6 (six) hours as needed for wheezing or shortness of breath. 08/12/17   Tukov-Yual, Alroy BailiffMagdalene S, NP  aspirin-acetaminophen-caffeine (EXCEDRIN MIGRAINE) 2701963339250-250-65 MG tablet Take 1 tablet by mouth every 6 (six) hours as needed for headache. 08/12/17   Tukov-Yual, Alroy BailiffMagdalene S, NP  butalbital-acetaminophen-caffeine (FIORICET, ESGIC) 50-325-40 MG tablet Take 1-2 tablets by mouth every 6 (six) hours as needed for headache. 03/16/17 03/16/18  Rebecka ApleyWebster, Allison P, MD  cyclobenzaprine (FLEXERIL) 10 MG tablet TAKE ONE TABLET BY MOUTH 3 TIMES A DAY AS NEEDED FOR MUSCLE SPASMS Patient taking differently: Take 10 mg by mouth 3 (three) times daily as needed for muscle spasms.  09/11/16   Virl Axehaplin, Don C, MD  fluticasone (FLONASE) 50 MCG/ACT nasal spray Place 2 sprays into both nostrils daily. Patient taking differently: Place 2 sprays into both nostrils daily as needed for allergies.  08/12/17   Tukov-Yual, Alroy BailiffMagdalene S, NP  HYDROcodone-acetaminophen (NORCO) 5-325 MG per tablet Take 1 tablet by mouth every 6 (six) hours as needed for moderate pain or severe pain. Patient not taking: Reported on 03/15/2018 07/02/14   Garrel Ridgeluffian, III William C, PA-C  hydrOXYzine (VISTARIL) 25 MG capsule Take 1 capsule (25 mg total) by mouth every 6 (six) hours as needed for itching. 06/04/17   Evon SlackGaines, Thomas C, PA-C  lactulose (CHRONULAC) 10 GM/15ML solution Take 15 mLs (10 g total) by mouth 2 (two) times daily as needed for mild constipation. 08/12/17   Tukov-Yual, Alroy BailiffMagdalene S, NP  loratadine (CLARITIN) 10 MG tablet Take 1 tablet (10 mg total) by mouth daily. Patient not taking: Reported on 03/15/2018 08/12/17 08/14/18  Andreas Ohmukov-Yual, Magdalene S, NP  promethazine (PHENERGAN) 25 MG tablet Take 1 tablet (25 mg total) by mouth every 8 (eight) hours as needed for nausea or vomiting. 08/12/17   Tukov-Yual, Alroy BailiffMagdalene S, NP     ALLERGIES:  Allergies  Allergen Reactions  . Ibuprofen Swelling      SOCIAL HISTORY:  Social History   Socioeconomic History  . Marital status: Divorced    Spouse name: Not on file  . Number of children: Not on file  . Years of education: Not on file  . Highest education level: Not on file  Occupational History  . Not on file  Social Needs  . Financial resource strain: Very hard  . Food insecurity:    Worry: Often true    Inability: Often true  . Transportation needs:    Medical: Yes    Non-medical: Yes  Tobacco Use  . Smoking status: Current Every Day Smoker    Packs/day: 0.50    Years: 20.00    Pack years: 10.00    Types: Cigarettes  . Smokeless tobacco: Never Used  Substance and Sexual Activity  . Alcohol use: No    Alcohol/week: 0.0 standard drinks  . Drug use: No  . Sexual activity: Not on file  Lifestyle  . Physical activity:    Days per week: 4 days    Minutes per session: 50 min  . Stress: Very much  Relationships  . Social connections:    Talks on phone: Three times a week    Gets together: Three times a week    Attends religious service: 1 to 4 times per  year    Active member of club or organization: No    Attends meetings of clubs or organizations: Never    Relationship status: Not on file  . Intimate partner violence:    Fear of current or ex partner: No    Emotionally abused: No    Physically abused: No    Forced sexual activity: No  Other Topics Concern  . Not on file  Social History Narrative   Lives with her son.   Walks 10+ miles to get to work   People can sometimes help give rides   -- food and bills are a huge concern to her       Patient signed inform consent to release information for possible refferals      FAMILY HISTORY:  Family History  Problem Relation Age of Onset  . COPD Mother   . Heart failure Mother   . Hypertension Mother   . Hypertension Father   . Heart disease Father   . Hypertension Sister   . Hypertension Brother     Otherwise negative.   REVIEW OF SYSTEMS:  Review of  Systems  Constitutional: Negative for chills and fever.  Respiratory: Negative for cough and shortness of breath.   Cardiovascular: Negative for chest pain and palpitations.  Gastrointestinal: Positive for abdominal pain and nausea. Negative for diarrhea and vomiting.  Genitourinary: Negative for dysuria and urgency.  Neurological: Negative for dizziness and headaches.  All other systems reviewed and are negative.   VITAL SIGNS:  Temp:  [98.4 F (36.9 C)] 98.4 F (36.9 C) (01/28 0907) Pulse Rate:  [71-87] 71 (01/28 1033) Resp:  [17-18] 18 (01/28 0907) BP: (139-199)/(89-101) 157/89 (01/28 1033) SpO2:  [97 %-100 %] 100 % (01/28 1033) Weight:  [135.2 kg] 135.2 kg (01/28 0003)     Height: 5\' 5"  (165.1 cm) Weight: 135.2 kg BMI (Calculated): 49.59   PHYSICAL EXAM:  Physical Exam Vitals signs and nursing note reviewed.  Constitutional:      General: She is not in acute distress.    Appearance: She is well-developed. She is obese. She is not ill-appearing.  HENT:     Head: Normocephalic and atraumatic.  Eyes:     General: No scleral icterus.    Extraocular Movements: Extraocular movements intact.  Cardiovascular:     Rate and Rhythm: Normal rate and regular rhythm.     Heart sounds: Normal heart sounds. No murmur. No friction rub. No gallop.   Pulmonary:     Effort: Pulmonary effort is normal. No respiratory distress.     Breath sounds: Normal breath sounds. No wheezing or rhonchi.  Abdominal:     General: Abdomen is protuberant. There is no distension.     Palpations: Abdomen is soft.     Tenderness: There is abdominal tenderness in the right upper quadrant and epigastric area. There is no guarding or rebound. Negative signs include Murphy's sign.  Genitourinary:    Comments: Deferred Skin:    General: Skin is warm and dry.     Coloration: Skin is not jaundiced or pale.  Neurological:     General: No focal deficit present.     Mental Status: She is alert and oriented to  person, place, and time.  Psychiatric:        Mood and Affect: Mood normal.        Behavior: Behavior normal.     INTAKE/OUTPUT:  This shift: No intake/output data recorded.  Last 2 shifts: @IOLAST2SHIFTS @  Labs:  CBC  Latest Ref Rng & Units 03/15/2018 06/04/2017 02/27/2016  WBC 4.0 - 10.5 K/uL 7.0 5.7 6.6  Hemoglobin 12.0 - 15.0 g/dL 16.114.5 09.613.3 04.512.9  Hematocrit 36.0 - 46.0 % 44.1 39.8 38.5  Platelets 150 - 400 K/uL 257 251 275   CMP Latest Ref Rng & Units 03/15/2018 06/04/2017 02/27/2016  Glucose 70 - 99 mg/dL 409(W137(H) 119(J103(H) 478(G108(H)  BUN 6 - 20 mg/dL 10 13 10   Creatinine 0.44 - 1.00 mg/dL 9.560.84 2.130.88 0.860.87  Sodium 135 - 145 mmol/L 136 139 142  Potassium 3.5 - 5.1 mmol/L 3.9 3.4(L) 4.2  Chloride 98 - 111 mmol/L 109 111 103  CO2 22 - 32 mmol/L 21(L) 22 23  Calcium 8.9 - 10.3 mg/dL 9.1 5.7(Q8.8(L) 8.9  Total Protein 6.5 - 8.1 g/dL 7.6 7.0 6.4  Total Bilirubin 0.3 - 1.2 mg/dL 0.3 4.6(N0.2(L) <6.2<0.2  Alkaline Phos 38 - 126 U/L 66 52 59  AST 15 - 41 U/L 20 22 19   ALT 0 - 44 U/L 19 16 19      Imaging studies:   US RUQ (03/15/2018) personally reviewed and radiologist report reviewed:  IMPRESSION: 1. Findings of acute calculus cholecystitis. 2. 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  Assessment/Plan: (ICD-10's: K80.00) 43 y.o. female with improving RUQ abdominal pain attributable to sonographic evidence of acute calculus cholecystitis, complicated by pertinent comorbidities including HTN, migraine with aura, obesity (BMI >45), and current tobacco abuse (smoking).    - Okay for clear liquids today + IVF, NPO after midnight  - Continue IV ABx (Rocephin)  - Pain control as needed, antiemetics as needed  - continue to monitor abdominal examination and on-going bowel function  - Will plan for laparoscopic cholecystectomy with Dr Earlene Plateravis tomorrow afternoon pending OR and anesthesia availability.   -All risks, benefits, and alternatives to above procedure were discussed with the patient, and all of  her questions were answered to her expressed satisfaction, patient expresses she wishes to proceed, and informed consent was obtained.  - medical management of comorbidities   - DVT prophylaxis  All of the above findings and recommendations were discussed with the patient, and all of her questions were answered to her expressed satisfaction.  -- Lynden OxfordZachary Schulz, PA-C [facility] 03/15/2018, 10:53 AM [phone] M-F: 7am - 4pm

## 2018-03-15 NOTE — Anesthesia Preprocedure Evaluation (Addendum)
Anesthesia Evaluation  Patient identified by MRN, date of birth, ID band Patient awake    Reviewed: Allergy & Precautions, H&P , NPO status , Patient's Chart, lab work & pertinent test results, reviewed documented beta blocker date and time   History of Anesthesia Complications Negative for: history of anesthetic complications  Airway Mallampati: III  TM Distance: >3 FB Neck ROM: full    Dental  (+) Chipped   Pulmonary asthma , COPD, Current Smoker,           Cardiovascular Exercise Tolerance: Good hypertension, Pt. on medications and Pt. on home beta blockers      Neuro/Psych  Headaches, PSYCHIATRIC DISORDERS Anxiety Depression  Neuromuscular disease negative psych ROS   GI/Hepatic Neg liver ROS, GERD  Medicated and Controlled,  Endo/Other  Morbid obesity  Renal/GU negative Renal ROS  negative genitourinary   Musculoskeletal negative musculoskeletal ROS (+)   Abdominal   Peds negative pediatric ROS (+)  Hematology negative hematology ROS (+)   Anesthesia Other Findings Past Medical History: No date: Hypertension 08/08/2015: Migraine with aura No date: Tarsal tunnel syndrome 08/08/2015: Tarsal tunnel syndrome of left side 08/08/2015: Tobacco abuse  Past Surgical History: No date: FOOT SURGERY; Left     Comment:  X's Three No date: TUBAL LIGATION  BMI    Body Mass Index:  49.59 kg/m      Reproductive/Obstetrics negative OB ROS                            Anesthesia Physical Anesthesia Plan  ASA: III  Anesthesia Plan: General   Post-op Pain Management:    Induction: Intravenous  PONV Risk Score and Plan: Ondansetron, Dexamethasone, Midazolam and Treatment may vary due to age or medical condition  Airway Management Planned: Oral ETT  Additional Equipment:   Intra-op Plan:   Post-operative Plan: Extubation in OR  Informed Consent: I have reviewed the patients History  and Physical, chart, labs and discussed the procedure including the risks, benefits and alternatives for the proposed anesthesia with the patient or authorized representative who has indicated his/her understanding and acceptance.     Dental advisory given  Plan Discussed with: CRNA and Surgeon  Anesthesia Plan Comments:        Anesthesia Quick Evaluation

## 2018-03-15 NOTE — ED Notes (Signed)
ED TO INPATIENT HANDOFF REPORT  Name/Age/Gender Brandi Swanson 43 y.o. female  Code Status    Code Status Orders  (From admission, onward)         Start     Ordered   03/15/18 0548  Full code  Continuous     03/15/18 0551        Code Status History    This patient has a current code status but no historical code status.      Home/SNF/Other Home  Chief Complaint Abdominal Pain  Level of Care/Admitting Diagnosis ED Disposition    ED Disposition Condition Comment   Admit  [facility] [[id number]]  Level of Care: Med-Surg [16]  Diagnosis: Acute cholecystitis due to biliary calculus [[id number]][[id number]]  Admitting Physician: Ancil LinseyAVIS, JASON EVAN [[id number]]  Attending Physician: Ancil LinseyAVIS, JASON EVAN [[id number]]  PT Class (Do Not Modify): Observation [104]  PT Acc Code (Do Not Modify): Observation [10022]       Medical History Past Medical History:  Diagnosis Date  . Hypertension   . Migraine with aura 08/08/2015  . Tarsal tunnel syndrome   . Tarsal tunnel syndrome of left side 08/08/2015  . Tobacco abuse 08/08/2015    Allergies Allergies  Allergen Reactions  . Ibuprofen Swelling    IV Location/Drains/Wounds Patient Lines/Drains/Airways Status   Active Line/Drains/Airways    Name:   Placement date:   Placement time:   Site:   Days:   Peripheral IV 03/15/18 Left Hand   03/15/18    0330    Hand   less than 1          Labs/Imaging Results for orders placed or performed during the hospital encounter of 03/15/18 (from the past 48 hour(s))  CBC with Differential     Status: Abnormal   Collection Time: 03/15/18 12:05 AM  Result Value Ref Range   WBC 7.0 4.0 - 10.5 K/uL   RBC 5.19 (H) 3.87 - 5.11 MIL/uL   Hemoglobin 14.5 12.0 - 15.0 g/dL   HCT 95.644.1 21.336.0 - 08.646.0 %   MCV 85.0 80.0 - 100.0 fL   MCH 27.9 26.0 - 34.0 pg   MCHC 32.9 30.0 - 36.0 g/dL   RDW 57.814.5 46.911.5 - 62.915.5 %   Platelets 257 150 - 400 K/uL   nRBC 0.0 0.0 - 0.2 %   Neutrophils Relative % 60 %   Neutro Abs 4.1 1.7 - 7.7 K/uL   Lymphocytes Relative 27 %   Lymphs Abs 1.9 0.7 - 4.0 K/uL   Monocytes Relative 10 %   Monocytes Absolute 0.7 0.1 - 1.0 K/uL   Eosinophils Relative 2 %   Eosinophils Absolute 0.2 0.0 - 0.5 K/uL   Basophils Relative 1 %   Basophils Absolute 0.1 0.0 - 0.1 K/uL   Immature Granulocytes 0 %   Abs Immature Granulocytes 0.02 0.00 - 0.07 K/uL    Comment: Performed at [facility], [address] [place], [place] [address]  Comprehensive metabolic panel     Status: Abnormal   Collection Time: 03/15/18 12:05 AM  Result Value Ref Range   Sodium 136 135 - 145 mmol/L   Potassium 3.9 3.5 - 5.1 mmol/L   Chloride 109 98 - 111 mmol/L   CO2 21 (L) 22 - 32 mmol/L   Glucose, Bld 137 (H) 70 - 99 mg/dL   BUN 10 6 - 20 mg/dL   Creatinine, Ser 3.240.84 0.44 - 1.00 mg/dL   Calcium 9.1 8.9 - 40.110.3 mg/dL  Total Protein 7.6 6.5 - 8.1 g/dL   Albumin 4.4 3.5 - 5.0 g/dL   AST 20 15 - 41 U/L   ALT 19 0 - 44 U/L   Alkaline Phosphatase 66 38 - 126 U/L   Total Bilirubin 0.3 0.3 - 1.2 mg/dL   GFR calc non Af Amer >60 >60 mL/min   GFR calc Af Amer >60 >60 mL/min   Anion gap 6 5 - 15    Comment: Performed at [facility], [address] [place], [place] [address]  Lipase, blood     Status: None   Collection Time: 03/15/18 12:05 AM  Result Value Ref Range   Lipase 29 11 - 51 U/L    Comment: Performed at [facility], [address] [place], [place] [address]  Troponin I - ONCE - STAT     Status: None   Collection Time: 03/15/18 12:05 AM  Result Value Ref Range   Troponin I <0.03 <0.03 ng/mL    Comment: Performed at [facility], [address] [place], [place] [address]  Urinalysis, Complete w Microscopic     Status: Abnormal   Collection Time: 03/15/18 12:07 AM  Result Value Ref Range   Color, Urine YELLOW (A) YELLOW   APPearance CLEAR (A) CLEAR   Specific Gravity, Urine 1.017 1.005 - 1.030   pH 5.0 5.0 -  8.0   Glucose, UA NEGATIVE NEGATIVE mg/dL   Hgb urine dipstick NEGATIVE NEGATIVE   Bilirubin Urine NEGATIVE NEGATIVE   Ketones, ur NEGATIVE NEGATIVE mg/dL   Protein, ur NEGATIVE NEGATIVE mg/dL   Nitrite NEGATIVE NEGATIVE   Leukocytes, UA NEGATIVE NEGATIVE   RBC / HPF 0-5 0 - 5 RBC/hpf   WBC, UA 0-5 0 - 5 WBC/hpf   Bacteria, UA NONE SEEN NONE SEEN   Squamous Epithelial / LPF 0-5 0 - 5   Mucus PRESENT     Comment: Performed at [facility], [address] [place], [place] [address]  Pregnancy, urine POC     Status: None   Collection Time: 03/15/18 12:16 AM  Result Value Ref Range   Preg Test, Ur NEGATIVE NEGATIVE    Comment:        THE SENSITIVITY OF THIS METHODOLOGY IS >24 mIU/mL    [place] Abdomen Limited Ruq  Result Date: 03/15/2018 CLINICAL DATA:  Severe epigastric pain with nausea for 8 hours EXAM: ULTRASOUND ABDOMEN LIMITED RIGHT UPPER QUADRANT COMPARISON:  08/27/2008 FINDINGS: Gallbladder: Gallstone fixed in the gallbladder neck. The gallbladder wall is thickened and mildly striated. Ring down artifact consistent with adenomyomatosis. Trace pericholecystic fluid. Positive sonographic Murphy sign. 11 mm area of mid level echoes at the gallbladder fundus that does not appear to have a stalk, most consistent with sludge. Color Doppler was not applied. Common bile duct: Diameter: 4 mm.  Where visualized, no filling defect. Liver: No focal lesion identified. Within normal limits in parenchymal echogenicity. Portal vein is patent on color Doppler imaging with normal direction of blood flow towards the liver. IMPRESSION: 1. Findings of acute calculus cholecystitis. 2. 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 Electronically Signed   By: Marnee SpringJonathon  Watts M.D.   On: 03/15/2018 04:58    Pending Labs Unresulted Labs (From admission, onward)    Start     Ordered   03/15/18 0548  HIV antibody (Routine Testing)  Once,   STAT     03/15/18 0551          Vitals/Pain Today's  Vitals   03/15/18  0305 03/15/18 0455 03/15/18 0455 03/15/18 0543  BP: (!) 149/95  (!) 154/89 (!) 153/100  Pulse: 80  79 77  Resp: 17  17 17   Temp:      TempSrc:      SpO2: 100%  99% 100%  Weight:      Height:      PainSc:  7       Isolation Precautions No active isolations  Medications Medications  heparin injection 5,000 Units (has no administration in time range)  dextrose 5 % in lactated ringers infusion (has no administration in time range)  cefTRIAXone (ROCEPHIN) 2 g in sodium chloride 0.9 % 100 mL IVPB (has no administration in time range)  acetaminophen (TYLENOL) tablet 650 mg (has no administration in time range)    Or  acetaminophen (TYLENOL) suppository 650 mg (has no administration in time range)  fentaNYL (SUBLIMAZE) injection 50 mcg (has no administration in time range)  ondansetron (ZOFRAN-ODT) disintegrating tablet 4 mg (has no administration in time range)    Or  ondansetron (ZOFRAN) injection 4 mg (has no administration in time range)  albuterol (PROVENTIL) (2.5 MG/3ML) 0.083% nebulizer solution 2.5 mg (has no administration in time range)  mometasone-formoterol (DULERA) 200-5 MCG/ACT inhaler 2 puff (has no administration in time range)  gabapentin (NEURONTIN) capsule 300 mg (has no administration in time range)  pantoprazole (PROTONIX) EC tablet 40 mg (has no administration in time range)  propranolol (INDERAL) tablet 40 mg (has no administration in time range)  morphine 4 MG/ML injection 4 mg (4 mg Intravenous Given 03/15/18 0340)  ondansetron (ZOFRAN) injection 4 mg (4 mg Intravenous Given 03/15/18 0340)  0.9 %  sodium chloride infusion ( Intravenous New Bag/Given 03/15/18 0543)    Mobility walks

## 2018-03-16 ENCOUNTER — Encounter: Payer: Self-pay | Admitting: Anesthesiology

## 2018-03-16 ENCOUNTER — Observation Stay: Payer: Self-pay | Admitting: Anesthesiology

## 2018-03-16 ENCOUNTER — Encounter
Admission: EM | Disposition: A | Discharge status: Home / Self Care | Payer: Self-pay | Source: Home / Self Care | Attending: Surgery

## 2018-03-16 HISTORY — PX: CHOLECYSTECTOMY: SHX55

## 2018-03-16 LAB — HIV ANTIBODY (ROUTINE TESTING W REFLEX): HIV Screen 4th Generation wRfx: NONREACTIVE

## 2018-03-16 SURGERY — LAPAROSCOPIC CHOLECYSTECTOMY
Anesthesia: General | Site: Abdomen

## 2018-03-16 MED ORDER — ACETAMINOPHEN 500 MG PO TABS: 1000.0000 mg | ORAL_TABLET | Freq: Four times a day (QID) | ORAL | Status: DC

## 2018-03-16 MED ORDER — OXYCODONE HCL 5 MG PO TABS: 5.0000 mg | ORAL_TABLET | Freq: Once | ORAL | Status: DC | PRN

## 2018-03-16 MED ORDER — MIDAZOLAM HCL 2 MG/2ML IJ SOLN
INTRAMUSCULAR | Status: AC
  Filled 2018-03-16: qty 2

## 2018-03-16 MED ORDER — FENTANYL CITRATE (PF) 100 MCG/2ML IJ SOLN
25.0000 ug | INTRAMUSCULAR | Status: DC | PRN
  Administered 2018-03-16 (×2): 25 ug via INTRAVENOUS
  Administered 2018-03-16: 50 ug via INTRAVENOUS
  Administered 2018-03-16 (×2): 25 ug via INTRAVENOUS

## 2018-03-16 MED ORDER — LIDOCAINE HCL 1 % IJ SOLN
INTRAMUSCULAR | Status: DC | PRN
  Administered 2018-03-16: 20 mL

## 2018-03-16 MED ORDER — ROCURONIUM BROMIDE 100 MG/10ML IV SOLN
INTRAVENOUS | Status: DC | PRN
  Administered 2018-03-16: 5 mg via INTRAVENOUS
  Administered 2018-03-16: 35 mg via INTRAVENOUS

## 2018-03-16 MED ORDER — HYDROCHLOROTHIAZIDE 25 MG PO TABS
25.0000 mg | ORAL_TABLET | Freq: Every day | ORAL | Status: DC
  Administered 2018-03-16: 25 mg via ORAL
  Filled 2018-03-16: qty 1

## 2018-03-16 MED ORDER — FENTANYL CITRATE (PF) 250 MCG/5ML IJ SOLN
INTRAMUSCULAR | Status: AC
  Filled 2018-03-16: qty 5

## 2018-03-16 MED ORDER — PROPOFOL 10 MG/ML IV BOLUS
INTRAVENOUS | Status: AC
  Filled 2018-03-16: qty 20

## 2018-03-16 MED ORDER — ENOXAPARIN SODIUM 40 MG/0.4ML ~~LOC~~ SOLN
40.0000 mg | SUBCUTANEOUS | Status: DC
  Administered 2018-03-17: 40 mg via SUBCUTANEOUS
  Filled 2018-03-16: qty 0.4

## 2018-03-16 MED ORDER — GLYCOPYRROLATE 0.2 MG/ML IJ SOLN
INTRAMUSCULAR | Status: DC | PRN
  Administered 2018-03-16: 0.2 mg via INTRAVENOUS

## 2018-03-16 MED ORDER — SUCCINYLCHOLINE CHLORIDE 20 MG/ML IJ SOLN
INTRAMUSCULAR | Status: AC
  Filled 2018-03-16: qty 1

## 2018-03-16 MED ORDER — ONDANSETRON HCL 4 MG/2ML IJ SOLN
INTRAMUSCULAR | Status: DC | PRN
Start: 2018-03-16 — End: 2018-03-16
  Administered 2018-03-16: 4 mg via INTRAVENOUS

## 2018-03-16 MED ORDER — FENTANYL CITRATE (PF) 100 MCG/2ML IJ SOLN
INTRAMUSCULAR | Status: AC
  Administered 2018-03-16: 25 ug via INTRAVENOUS
  Filled 2018-03-16: qty 2

## 2018-03-16 MED ORDER — DEXAMETHASONE SODIUM PHOSPHATE 10 MG/ML IJ SOLN
INTRAMUSCULAR | Status: AC
  Filled 2018-03-16: qty 1

## 2018-03-16 MED ORDER — OXYCODONE HCL 5 MG PO TABS
5.0000 mg | ORAL_TABLET | ORAL | Status: DC | PRN
  Administered 2018-03-16 – 2018-03-17 (×5): 5 mg via ORAL
  Filled 2018-03-16 (×5): qty 1

## 2018-03-16 MED ORDER — BUTALBITAL-APAP-CAFFEINE 50-325-40 MG PO TABS
1.0000 | ORAL_TABLET | Freq: Four times a day (QID) | ORAL | Status: DC | PRN
  Administered 2018-03-16: 2 via ORAL
  Filled 2018-03-16 (×2): qty 4
  Filled 2018-03-16: qty 1
  Filled 2018-03-16: qty 2
  Filled 2018-03-16 (×2): qty 4

## 2018-03-16 MED ORDER — ONDANSETRON HCL 4 MG/2ML IJ SOLN
INTRAMUSCULAR | Status: AC
  Filled 2018-03-16: qty 2

## 2018-03-16 MED ORDER — HYDRALAZINE HCL 20 MG/ML IJ SOLN: 5.0000 mg | INTRAMUSCULAR | Status: DC | PRN

## 2018-03-16 MED ORDER — LIDOCAINE HCL (CARDIAC) PF 100 MG/5ML IV SOSY
PREFILLED_SYRINGE | INTRAVENOUS | Status: DC | PRN
  Administered 2018-03-16: 50 mg via INTRAVENOUS

## 2018-03-16 MED ORDER — MIDAZOLAM HCL 2 MG/2ML IJ SOLN
INTRAMUSCULAR | Status: DC | PRN
  Administered 2018-03-16: 2 mg via INTRAVENOUS

## 2018-03-16 MED ORDER — FENTANYL CITRATE (PF) 100 MCG/2ML IJ SOLN
INTRAMUSCULAR | Status: DC | PRN
  Administered 2018-03-16 (×3): 50 ug via INTRAVENOUS
  Administered 2018-03-16: 100 ug via INTRAVENOUS

## 2018-03-16 MED ORDER — ACETAMINOPHEN 500 MG PO TABS
1000.0000 mg | ORAL_TABLET | ORAL | Status: AC
  Administered 2018-03-16: 1000 mg via ORAL
  Filled 2018-03-16: qty 2

## 2018-03-16 MED ORDER — LACTATED RINGERS IV SOLN
INTRAVENOUS | Status: DC | PRN
  Administered 2018-03-16: 10:00:00 via INTRAVENOUS

## 2018-03-16 MED ORDER — SUCCINYLCHOLINE CHLORIDE 20 MG/ML IJ SOLN
INTRAMUSCULAR | Status: DC | PRN
  Administered 2018-03-16: 100 mg via INTRAVENOUS

## 2018-03-16 MED ORDER — SUGAMMADEX SODIUM 200 MG/2ML IV SOLN
INTRAVENOUS | Status: AC
  Filled 2018-03-16: qty 2

## 2018-03-16 MED ORDER — AMITRIPTYLINE HCL 25 MG PO TABS
25.0000 mg | ORAL_TABLET | Freq: Once | ORAL | Status: AC
  Administered 2018-03-16: 25 mg via ORAL
  Filled 2018-03-16: qty 1

## 2018-03-16 MED ORDER — DEXAMETHASONE SODIUM PHOSPHATE 10 MG/ML IJ SOLN
INTRAMUSCULAR | Status: DC | PRN
  Administered 2018-03-16: 10 mg via INTRAVENOUS

## 2018-03-16 MED ORDER — CYCLOBENZAPRINE HCL 10 MG PO TABS
10.0000 mg | ORAL_TABLET | Freq: Three times a day (TID) | ORAL | Status: DC | PRN
  Filled 2018-03-16: qty 1

## 2018-03-16 MED ORDER — FENTANYL CITRATE (PF) 100 MCG/2ML IJ SOLN
INTRAMUSCULAR | Status: AC
  Administered 2018-03-16: 50 ug via INTRAVENOUS
  Filled 2018-03-16: qty 2

## 2018-03-16 MED ORDER — LIDOCAINE HCL (PF) 2 % IJ SOLN
INTRAMUSCULAR | Status: AC
  Filled 2018-03-16: qty 10

## 2018-03-16 MED ORDER — SUGAMMADEX SODIUM 200 MG/2ML IV SOLN
INTRAVENOUS | Status: DC | PRN
  Administered 2018-03-16: 200 mg via INTRAVENOUS

## 2018-03-16 MED ORDER — HYDRALAZINE HCL 20 MG/ML IJ SOLN
5.0000 mg | INTRAMUSCULAR | Status: DC | PRN
  Administered 2018-03-16 (×3): 10 mg via INTRAVENOUS
  Filled 2018-03-16 (×3): qty 1

## 2018-03-16 MED ORDER — SODIUM CHLORIDE 0.9% FLUSH
10.0000 mL | Freq: Three times a day (TID) | INTRAVENOUS | Status: DC
  Administered 2018-03-16 (×3): 10 mL via INTRAVENOUS

## 2018-03-16 MED ORDER — ACETAMINOPHEN 500 MG PO TABS
1000.0000 mg | ORAL_TABLET | Freq: Four times a day (QID) | ORAL | Status: DC
  Administered 2018-03-16 – 2018-03-17 (×2): 1000 mg via ORAL
  Filled 2018-03-16 (×3): qty 2

## 2018-03-16 MED ORDER — ROCURONIUM BROMIDE 50 MG/5ML IV SOLN
INTRAVENOUS | Status: AC
  Filled 2018-03-16: qty 1

## 2018-03-16 MED ORDER — GLYCOPYRROLATE 0.2 MG/ML IJ SOLN
INTRAMUSCULAR | Status: AC
  Filled 2018-03-16: qty 1

## 2018-03-16 MED ORDER — OXYCODONE HCL 5 MG/5ML PO SOLN: 5.0000 mg | Freq: Once | ORAL | Status: DC | PRN

## 2018-03-16 MED ORDER — PROPOFOL 10 MG/ML IV BOLUS
INTRAVENOUS | Status: DC | PRN
  Administered 2018-03-16: 20 mg via INTRAVENOUS
  Administered 2018-03-16: 180 mg via INTRAVENOUS

## 2018-03-16 SURGICAL SUPPLY — 38 items
APPLIER CLIP ROT 10 11.4 M/L (STAPLE) ×3
CHLORAPREP W/TINT 26ML (MISCELLANEOUS) ×3 IMPLANT
CLIP APPLIE ROT 10 11.4 M/L (STAPLE) ×1 IMPLANT
COVER WAND RF STERILE (DRAPES) ×3 IMPLANT
DECANTER SPIKE VIAL GLASS SM (MISCELLANEOUS) ×6 IMPLANT
DERMABOND ADVANCED (GAUZE/BANDAGES/DRESSINGS) ×2
DERMABOND ADVANCED .7 DNX12 (GAUZE/BANDAGES/DRESSINGS) ×1 IMPLANT
DRESSING SURGICEL FIBRLLR 1X2 (HEMOSTASIS) IMPLANT
DRSG SURGICEL FIBRILLAR 1X2 (HEMOSTASIS)
ELECT REM PT RETURN 9FT ADLT (ELECTROSURGICAL) ×3
ELECTRODE REM PT RTRN 9FT ADLT (ELECTROSURGICAL) ×1 IMPLANT
GLOVE BIO SURGEON STRL SZ7 (GLOVE) ×3 IMPLANT
GLOVE BIOGEL PI IND STRL 7.5 (GLOVE) ×1 IMPLANT
GLOVE BIOGEL PI INDICATOR 7.5 (GLOVE) ×2
GOWN STRL REUS W/ TWL LRG LVL3 (GOWN DISPOSABLE) ×3 IMPLANT
GOWN STRL REUS W/TWL LRG LVL3 (GOWN DISPOSABLE) ×6
GRASPER SUT TROCAR 14GX15 (MISCELLANEOUS) ×3 IMPLANT
IRRIGATION STRYKERFLOW (MISCELLANEOUS) IMPLANT
IRRIGATOR STRYKERFLOW (MISCELLANEOUS) ×3
IV NS 1000ML (IV SOLUTION) ×2
IV NS 1000ML BAXH (IV SOLUTION) ×1 IMPLANT
KIT TURNOVER KIT A (KITS) ×3 IMPLANT
LABEL OR SOLS (LABEL) ×3 IMPLANT
NDL INSUFFLATION 14GA 120MM (NEEDLE) ×1 IMPLANT
NEEDLE HYPO 22GX1.5 SAFETY (NEEDLE) ×3 IMPLANT
NEEDLE INSUFFLATION 14GA 120MM (NEEDLE) ×3 IMPLANT
NS IRRIG 1000ML POUR BTL (IV SOLUTION) ×3 IMPLANT
PACK LAP CHOLECYSTECTOMY (MISCELLANEOUS) ×3 IMPLANT
PORT ACCESS TROCAR AIRSEAL 12 (TROCAR) ×1 IMPLANT
PORT ACCESS TROCAR AIRSEAL 5M (TROCAR) ×2
POUCH SPECIMEN RETRIEVAL 10MM (ENDOMECHANICALS) ×3 IMPLANT
SCISSORS METZENBAUM CVD 33 (INSTRUMENTS) ×2 IMPLANT
SET TRI-LUMEN FLTR TB AIRSEAL (TUBING) ×3 IMPLANT
SLEEVE ENDOPATH XCEL 5M (ENDOMECHANICALS) ×6 IMPLANT
SUT MNCRL AB 4-0 PS2 18 (SUTURE) ×3 IMPLANT
SUT VICRYL 0 UR6 27IN ABS (SUTURE) ×3 IMPLANT
SUT VICRYL AB 3-0 FS1 BRD 27IN (SUTURE) ×3 IMPLANT
TROCAR XCEL NON-BLD 5MMX100MML (ENDOMECHANICALS) ×3 IMPLANT

## 2018-03-16 NOTE — Anesthesia Procedure Notes (Signed)
Procedure Name: Intubation Performed by: Mathews Argyle, CRNA Pre-anesthesia Checklist: Patient identified, Patient being monitored, Timeout performed, Emergency Drugs available and Suction available Patient Re-evaluated:Patient Re-evaluated prior to induction Oxygen Delivery Method: Circle system utilized Preoxygenation: Pre-oxygenation with 100% oxygen Induction Type: IV induction Ventilation: Mask ventilation without difficulty Laryngoscope Size: 3 and McGraph Grade View: Grade I Tube type: Oral Tube size: 7.0 mm Number of attempts: 1 Airway Equipment and Method: Stylet and Video-laryngoscopy Placement Confirmation: ETT inserted through vocal cords under direct vision,  positive ETCO2 and breath sounds checked- equal and bilateral Secured at: 21 cm Tube secured with: Tape Dental Injury: Teeth and Oropharynx as per pre-operative assessment  Difficulty Due To: Difficulty was anticipated, Difficult Airway- due to dentition, Difficult Airway- due to anterior larynx and Difficult Airway- due to reduced neck mobility Future Recommendations: Recommend- induction with short-acting agent, and alternative techniques readily available

## 2018-03-16 NOTE — Progress Notes (Signed)
   03/16/18 2056  Clinical Encounter Type  Visited With Patient and family together  Visit Type Code  Referral From Nurse  Spiritual Encounters  Spiritual Needs Prayer;Emotional  Stress Factors  Patient Stress Factors Health changes  Family Stress Factors Health changes;Lack of knowledge    Chaplain responded to a RRT page to the patient's room. Upon arrival, patient was in the process of being accessed by medical team. Awake, alert and sitting upon in bed. Patient answers questions appropriately, but her sister notes that her responses are "slower than usual." Nurses administered blood pressure medications to regulate her BP and discussed taking her to the step-down unit for closer monitoring. Her sister, "Red" offered reassurance to the patient. Chaplain provided emotional support, maintained pastoral presence, and offered prayer. Prayer for the patient's health, regulation of BP, resources and care after her hospital stay and for her understanding, per her sister's request. Nurse will call or page if necessary.

## 2018-03-16 NOTE — Discharge Instructions (Signed)
In addition to included general post-operative instructions for laparoscopic cholecystectomy (gallbladder removal),  Diet: Resume home diet.   Activity: No heavy lifting >20 pounds (children, pets, laundry, garbage) or strenuous activity until follow-up, but light activity and walking are encouraged. Do not drive or drink alcohol if taking narcotic pain medications.  Wound care: 2 days after surgery (01/31), you may shower/get incision wet with soapy water and pat dry (do not rub incisions), but no baths or submerging incision underwater until follow-up.   Medications: Resume all home medications. For mild to moderate pain: acetaminophen (Tylenol) or ibuprofen/naproxen (if no kidney disease). Combining Tylenol with alcohol can substantially increase your risk of causing liver disease. Narcotic pain medications, if prescribed, can be used for severe pain, though may cause nausea, constipation, and drowsiness. Do not combine Tylenol and Percocet (or similar) within a 6 hour period as Percocet (and similar) contain(s) Tylenol. If you do not need the narcotic pain medication, you do not need to fill the prescription.  Call office [phone] / [phone]) at any time if any questions, worsening pain, fevers/chills, bleeding, drainage from incision site, or other concerns.

## 2018-03-16 NOTE — Op Note (Signed)
SURGICAL OPERATIVE REPORT   DATE OF PROCEDURE: 03/16/2018  ATTENDING Surgeon(s): Ancil Linsey, MD  ANESTHESIA: GETA  PRE-OPERATIVE DIAGNOSIS: Acute Cholecystitis (K80.00)  POST-OPERATIVE DIAGNOSIS: Acute Cholecystitis (K80.00)  PROCEDURE(S): (cpt's: 47562) 1.) Laparoscopic Cholecystectomy  INTRAOPERATIVE FINDINGS: Moderate pericholecystic inflammation, focused primarily at the lower gallbladder/infundibulum, with cystic duct and cystic artery clips well-secured, hemostasis at completion of procedure  INTRAOPERATIVE FLUIDS: 700 mL crystalloid   ESTIMATED BLOOD LOSS: Minimal (<30 mL)   URINE OUTPUT: No foley  SPECIMENS: Gallbladder  IMPLANTS: None  DRAINS: None   COMPLICATIONS: None apparent   CONDITION AT COMPLETION: Hemodynamically stable and extubated  DISPOSITION: PACU   INDICATION(S) FOR PROCEDURE:  Patient is a 43 y.o. female who this admission presented with post-prandial RUQ > epigastric abdominal pain. Ultrasound suggested acute cholecystitis. All risks, benefits, and alternatives to above elective procedures were discussed with the patient, who elected to proceed, and informed consent was accordingly obtained at that time.   DETAILS OF PROCEDURE:  Patient was brought to the operating suite and appropriately identified. General anesthesia was administered along with peri-operative prophylactic IV antibiotics, and endotracheal intubation was performed by anesthesiologist, along with NG/OG tube for gastric decompression. In supine position, operative site was prepped and draped in usual sterile fashion, and following a brief time out, initial 5 mm incision was made in a natural skin crease just above the umbilicus. Fascia was then elevated, and a Verress needle was inserted and its proper position confirmed using aspiration and saline meniscus test.  Upon insufflation of the abdominal cavity with carbon dioxide to a well-tolerated pressure of 12-15 mmHg, 5 mm  peri-umbilical port followed by laparoscope were inserted and used to inspect the abdominal cavity and its contents with no injuries from insertion of the first trochar noted. Three additional trocars were inserted, one at the epigastric position (10 mm) and two along the Right costal margin (5 mm). The table was then placed in reverse Trendelenburg position with the Right side up. Filmy adhesions between the gallbladder and omentum/duodenum/transverse colon were lysed using combined blunt dissection and selective electrocautery. The apex/dome of the gallbladder was grasped with an atraumatic grasper passed through the lateral port and retracted apically over the liver. The infundibulum was also grasped and retracted, exposing Calot's triangle. The peritoneum overlying the gallbladder infundibulum was incised and dissected free of surrounding peritoneal attachments, revealing the cystic duct and cystic artery, which were clipped twice on the patient side and once on the gallbladder specimen side close to the gallbladder. The gallbladder was then dissected from its peritoneal attachments to the liver using electrocautery, and the gallbladder was placed into a laparoscopic specimen bag and removed from the abdominal cavity via the epigastric port site. Hemostasis and secure placement of clips were confirmed, and intra-peritoneal cavity was inspected with no additional findings. PMI laparoscopic fascial closure device was then used to re-approximate fascia at the 10 mm epigastric port site.  All ports were then removed under direct visualization, and abdominal cavity was desuflated. All port sites were irrigated/cleaned, additional local anesthetic was injected at each incision, 3-0 Vicryl was used to re-approximate dermis at 10 mm port site(s), and subcuticular 4-0 Monocryl suture was used to re-approximate skin. Skin was then cleaned, dried, and sterile skin glue was applied. Patient was then safely able to be  awakened, extubated, and transferred to PACU for post-operative monitoring and care.   I was present for all aspects of the above procedure, and no operative complications were apparent.

## 2018-03-16 NOTE — Significant Event (Signed)
Rapid Response Event Note  Overview: rapid response was called to room 347 at 20:56.       Initial Focused Assessment and Interventions:    Pt found in bed, alert, answering all questions appropriately.  Primary nurse at bedside as well as sister, Red.  Pt found to have elevated BP, systolic in the 170's.  BP meds were given as ordered.  Pt also c/o a migraine headache of which, per pt, she has a history of.  Tylenol was given as ordered.  Surgeon was notified.  Decision was made for pt to remain in room 347.  Direct number to ICU charge nurse was left with pt's primary nurse.  Will follow up.   As a side note, during our conversation, pt also stated that she has problems getting her BP medications.  Suggestion was made to request social work consult if not already done.   at      at          [facility] R

## 2018-03-16 NOTE — Anesthesia Postprocedure Evaluation (Signed)
Anesthesia Post Note  Patient: Brandi Swanson  Procedure(s) Performed: LAPAROSCOPIC CHOLECYSTECTOMY (N/A Abdomen)  Patient location during evaluation: PACU Anesthesia Type: General Level of consciousness: awake and alert Pain management: pain level controlled Vital Signs Assessment: post-procedure vital signs reviewed and stable Respiratory status: spontaneous breathing, nonlabored ventilation, respiratory function stable and patient connected to nasal cannula oxygen Cardiovascular status: blood pressure returned to baseline and stable Postop Assessment: no apparent nausea or vomiting Anesthetic complications: no     Last Vitals:  Vitals:   03/16/18 1246 03/16/18 1315  BP:  (!) 156/92  Pulse: 68 76  Resp: 16 16  Temp:  36.8 C  SpO2: 96% 98%    Last Pain:  Vitals:   03/16/18 1315  TempSrc: Oral  PainSc: 8                  Cleda Mccreedy Daysean Tinkham

## 2018-03-16 NOTE — Progress Notes (Signed)
RN in room to give iv hydralazine; iv site will NOT flush at this time; RN removed site in left hand and attempted iv start in right hand; RN got positive blood return but site was not able to be flushed; RN put in STAT order for iv team to come start iv due to pt needing iv hydralazine; RN contacted [facility] to get a phone number for iv team; per [facility] there is no number for iv team and "they will get to her when they can"; [facility] said she would try to notify them of this iv needing to be started in room 347

## 2018-03-16 NOTE — Transfer of Care (Signed)
Immediate Anesthesia Transfer of Care Note  Patient: Brandi Swanson  Procedure(s) Performed: LAPAROSCOPIC CHOLECYSTECTOMY (N/A Abdomen)  Patient Location: PACU  Anesthesia Type:General  Level of Consciousness: sedated  Airway & Oxygen Therapy: Patient Spontanous Breathing and Patient connected to face mask oxygen  Post-op Assessment: Report given to RN and Post -op Vital signs reviewed and stable  Post vital signs: Reviewed  Last Vitals:  Vitals Value Taken Time  BP 98/73 03/16/2018 11:36 AM  Temp    Pulse 65 03/16/2018 11:36 AM  Resp 8 03/16/2018 11:36 AM  SpO2 100 % 03/16/2018 11:36 AM  Vitals shown include unvalidated device data.  Last Pain:  Vitals:   03/16/18 0928  TempSrc: Oral  PainSc: 6          Complications: No apparent anesthesia complications

## 2018-03-16 NOTE — Progress Notes (Signed)
Pt up to bathroom to void, CHG bath completed. SCDs applied, pre-op meds given.  To OR via bed by orderly Dorma RussellEdwin.

## 2018-03-16 NOTE — Anesthesia Post-op Follow-up Note (Signed)
Anesthesia QCDR form completed.        

## 2018-03-16 NOTE — Progress Notes (Signed)
UPDATE:  Called from floor re: persistent HTN with SBP in 170s.  Pt has now started complaining of blurry vision.  After reviewing her records, she is noted to have hx of HTN, on several meds from home, including propanolol that was only administered a few minutes prior to call.  hydralyzine x2 did not lower her BP.  In addition to hx of HTN, she is know to have migraine with aura, and I asked RN to ask if patient has issues of blurry vision prior to onset of her migraines in the past.  RN reports pt states she has had blurry vision as an aura prior to migraines, and thinks she is having another migraine now. Tylenol given already.  RN requesting to call rapid response, but stated that a hospitalist consult needs to be placed if a provider needs to be called for the rapid response.  I asked her to give patient a dose of her elavil from home and to reassess her in after administration to see if any relief in her symptoms as well as her HTN.

## 2018-03-17 MED ORDER — OXYCODONE HCL 5 MG PO TABS: 5.0000 mg | ORAL_TABLET | Freq: Four times a day (QID) | ORAL | 0 refills | Status: DC | PRN

## 2018-03-17 NOTE — Progress Notes (Signed)
Dc inst reviewed with pt and family.  Pt verb u/o of f/u care.

## 2018-03-17 NOTE — Plan of Care (Signed)
Vs WNL except BP; pt has chronic hypertension but the midnight and 4am BPs were much improved from the ones taken earlier in the shift; ambulates well; can get up to void independently; taking tylenol and roxicodone for pain control; has 2 BP meds; call MD if BP goes above 170/110; tolerating clear liquids so pt's diet advanced to regular heart healthy for this morning

## 2018-03-17 NOTE — Discharge Summary (Signed)
[facility] SURGICAL DISCHARGE SUMMARY  Patient ID: Brandi Swanson MRN: [id number] DOB/AGE: 04/12/1975 43 y.o.  Admit date: 03/15/2018 Discharge date: 03/17/2018  Discharge Diagnoses Acute cholecysitis  Consultants None  Procedures -- 03/16/2018:  Laparoscopic Cholecystectomy  HPI: 43 y.o. female presented to [facility] ED today for abdominal pain. Patient reports the acute onset of RUQ abdominal pain after arriving home yesterday evening. This was shortly after breakfast. The pain is constant in nature and is alleviated by nothing. She does endorse associated nausea. She denied any fever, chills, CP, SOB, emesis, or bladder/bowel changes. Never had a history of similar pain in the past. No history of similar pain in the past. Previous abdominal surgeries are only positive for tubal ligation. Work up in the ED was concerning for acute cholecystitis.   Hospital Course: Informed consent was obtained and documented, and patient underwent uneventful laparoscopic cholecystectomy (Dr Satira Mccallumjason Davis, MD, 03/16/2018).  Post-operatively, the patient had issues with BP control but the patient's pain improved/resolved and advancement of patient's diet and ambulation were well-tolerated. The remainder of patient's hospital course was essentially unremarkable, and discharge planning was initiated accordingly with patient safely able to be discharged home with appropriate discharge instructions, pain control, and outpatient follow-up after all of her questions were answered to her expressed satisfaction.  Discharge Condition: Good   Physical Examination:  Constitutional: Well appearing female, NAD Pulmonary: Normal effort, no respiratory distress Gastrointestinal: Soft, incisional soreness, non-distended Skin: Laparoscopic incisions are CDI, no erythema, no drainage   Allergies as of 03/17/2018      Reactions   Ibuprofen Swelling      Medication List    STOP taking these  medications   butalbital-acetaminophen-caffeine 50-325-40 MG tablet Commonly known as:  FIORICET, ESGIC     TAKE these medications   albuterol 108 (90 Base) MCG/ACT inhaler Commonly known as:  PROVENTIL HFA;VENTOLIN HFA Inhale 2 puffs into the lungs every 6 (six) hours as needed for wheezing or shortness of breath.   amitriptyline 50 MG tablet Commonly known as:  ELAVIL Take 1 tablet (50 mg total) by mouth at bedtime.   aspirin-acetaminophen-caffeine 250-250-65 MG tablet Commonly known as:  EXCEDRIN MIGRAINE Take 1 tablet by mouth every 6 (six) hours as needed for headache.   cetirizine 10 MG tablet Commonly known as:  ZYRTEC TAKE ONE TABLET BY MOUTH EVERY DAY. REPLACES LORATADINE What changed:  See the new instructions.   cyclobenzaprine 10 MG tablet Commonly known as:  FLEXERIL TAKE ONE TABLET BY MOUTH 3 TIMES A DAY AS NEEDED FOR MUSCLE SPASMS What changed:  See the new instructions.   fluticasone 50 MCG/ACT nasal spray Commonly known as:  FLONASE Place 2 sprays into both nostrils daily. What changed:    when to take this  reasons to take this   Fluticasone-Salmeterol 250-50 MCG/DOSE Aepb Commonly known as:  ADVAIR DISKUS Inhale 1 puff into the lungs 2 (two) times daily.   gabapentin 300 MG capsule Commonly known as:  NEURONTIN Take 1 capsule (300 mg total) by mouth 3 (three) times daily.   hydrochlorothiazide 25 MG tablet Commonly known as:  HYDRODIURIL Take 1 tablet (25 mg total) by mouth daily.   hydrOXYzine 25 MG capsule Commonly known as:  VISTARIL Take 1 capsule (25 mg total) by mouth every 6 (six) hours as needed for itching.   lactulose 10 GM/15ML solution Commonly known as:  CHRONULAC Take 15 mLs (10 g total) by mouth 2 (two) times daily as needed for mild constipation.  omeprazole 20 MG capsule Commonly known as:  PRILOSEC Take 1 capsule (20 mg total) by mouth 2 (two) times daily before a meal.   oxyCODONE 5 MG immediate release  tablet Commonly known as:  Oxy IR/ROXICODONE Take 1 tablet (5 mg total) by mouth every 6 (six) hours as needed for severe pain.   promethazine 25 MG tablet Commonly known as:  PHENERGAN Take 1 tablet (25 mg total) by mouth every 8 (eight) hours as needed for nausea or vomiting.   propranolol 40 MG tablet Commonly known as:  INDERAL Take 1 tablet (40 mg total) by mouth 2 (two) times daily.        Follow-up Information    Ancil Linsey, MD. Schedule an appointment as soon as possible for a visit in 2 week(s).   Specialty:  General Surgery Why:  s/p lap chole Contact information: [address] [place] [address] [phone]            -- Lynden Oxford , PA-C [facility]  03/17/2018, 9:18 AM [phone] M-F: 7am - 4pm

## 2018-03-17 NOTE — Progress Notes (Signed)
Discharged to home with auxillary pushing her to car in [facility]

## 2018-03-17 NOTE — Care Management (Signed)
RNCM consult for medications needs.  Patient to discharge with rx for pain medication only and wishes to fill it at [facility].  RNCM notified patient that [facility] no longer accepts coupons and I would not be able to provide her assistance.   RNCM confirmed that patient is active with [facility] .  Patient states that she obtains her medications from [facility] .  Patient states "but they don't give me my good stuff like lyrica and paxil".  RNCM notified patient that [facility]  Does not provide pysch medications or narcotics.  RNCM confirmed with MD that scripts for home medications would not be provided at discharge.  RNCM recommended for patient to follow up with her PCP.

## 2018-03-18 LAB — SURGICAL PATHOLOGY

## 2018-03-31 ENCOUNTER — Ambulatory Visit (INDEPENDENT_AMBULATORY_CARE_PROVIDER_SITE_OTHER): Payer: Self-pay | Admitting: Surgery

## 2018-03-31 ENCOUNTER — Encounter: Payer: Self-pay | Admitting: Surgery

## 2018-03-31 ENCOUNTER — Other Ambulatory Visit: Payer: Self-pay

## 2018-03-31 VITALS — BP 138/84 | HR 87 | Temp 97.9°F | Ht 65.0 in | Wt 200.0 lb

## 2018-03-31 DIAGNOSIS — Z4889 Encounter for other specified surgical aftercare: Secondary | ICD-10-CM

## 2018-03-31 DIAGNOSIS — K8000 Calculus of gallbladder with acute cholecystitis without obstruction: Secondary | ICD-10-CM

## 2018-03-31 NOTE — Progress Notes (Signed)
Surgical Clinic Progress/Follow-up Note   HPI:  43 y.o. Female presents to clinic for post-op follow-up 15 Days s/p laparoscopic cholecystectomy Earlene Plater, 03/16/2018) for acute cholecystitis. Patient reports complete resolution of pre-operative pain and has been tolerating regular diet with +flatus and normal BM's except somewhat loose BM after fried chicken. She otherwise denies N/V, fever/chills, CP, or SOB.  Review of Systems:  Constitutional: denies fever/chills  Respiratory: denies shortness of breath, wheezing  Cardiovascular: denies chest pain, palpitations  Gastrointestinal: abdominal pain, N/V, and bowel function as per interval history Skin: Denies any other rashes or skin discolorations except post-surgical wounds as per interval history  Vital Signs:  BP 138/84   Pulse 87   Temp 97.9 F (36.6 C) (Skin)   Ht 5\' 5"  (1.651 m)   Wt 200 lb (90.7 kg)   LMP 03/05/2018 (Exact Date)   SpO2 98%   BMI 33.28 kg/m    Physical Exam:  Constitutional:  -- Overweight non-obese body habitus  -- Awake, alert, and oriented x3  Pulmonary:  -- No crackles -- Equal breath sounds bilaterally -- Breathing non-labored at rest Cardiovascular:  -- S1, S2 present  -- No pericardial rubs  Gastrointestinal:  -- Soft and non-distended, non-tender to palpation, no guarding/rebound tenderness -- Post-surgical incisions all well-approximated without any peri-incisional erythema or drainage -- No abdominal masses appreciated, pulsatile or otherwise  Musculoskeletal / Integumentary:  -- Wounds or skin discoloration: None appreciated except post-surgical incisions as described above (GI) -- Extremities: B/L UE and LE FROM, hands and feet warm, no edema   Imaging: No new pertinent imaging available for review  Assessment:  43 y.o. yo Female with a problem list including...  Patient Active Problem List   Diagnosis Date Noted  . Acute cholecystitis due to biliary calculus 03/15/2018  . Migraine  headache with aura 09/05/2015  . Foot pain, left 08/08/2015  . Tarsal tunnel syndrome of left side 08/08/2015  . Foot callus 08/08/2015  . Tobacco abuse 08/08/2015  . Migraine with aura 08/08/2015  . COPD (chronic obstructive pulmonary disease) (HCC) 07/04/2015  . Seasonal allergies 07/04/2015  . Acid reflux 07/04/2015  . Hypertension 07/04/2015  . Depression 07/04/2015  . Anxiety 07/04/2015    presents to clinic for post-op follow-up evaluation, doing well 15 Days s/p laparoscopic cholecystectomy Earlene Plater, 03/16/2018) for acute cholecystitis.  Plan:              - advance diet as tolerated              - okay to submerge incisions under water (baths, swimming) prn             - no heavy lifting >40 lbs x 2 more weeks, after which may gradually resume all activities without restrictions             - apply sunblock particularly to incisions with sun exposure to reduce pigmentation of scars             - return to clinic as needed, instructed to call office if any questions or concerns  All of the above recommendations were discussed with the patient, and all of patient's questions were answered to her expressed satisfaction.  -- Scherrie Gerlach Earlene Plater, MD, RPVI [place]: [facility] General Surgery - Partnering for exceptional care. Office: [phone]

## 2018-03-31 NOTE — Patient Instructions (Addendum)
Return as needed. No lifting over 40 pounds for the next two weeks. The patient is aware to call back for any questions or concerns.

## 2018-04-03 ENCOUNTER — Encounter: Payer: Self-pay | Admitting: Surgery

## 2018-05-12 ENCOUNTER — Other Ambulatory Visit: Payer: Self-pay | Admitting: Adult Health Nurse Practitioner

## 2018-05-12 ENCOUNTER — Other Ambulatory Visit: Payer: Self-pay | Admitting: Internal Medicine

## 2018-05-18 ENCOUNTER — Emergency Department
Admission: EM | Admit: 2018-05-18 | Discharge: 2018-05-18 | Disposition: A | Discharge status: Home / Self Care | Payer: Self-pay | Attending: Emergency Medicine | Admitting: Emergency Medicine

## 2018-05-18 ENCOUNTER — Encounter: Payer: Self-pay | Admitting: Physician Assistant

## 2018-05-18 ENCOUNTER — Other Ambulatory Visit: Payer: Self-pay

## 2018-05-18 DIAGNOSIS — I10 Essential (primary) hypertension: Secondary | ICD-10-CM | POA: Insufficient documentation

## 2018-05-18 DIAGNOSIS — L299 Pruritus, unspecified: Secondary | ICD-10-CM | POA: Insufficient documentation

## 2018-05-18 DIAGNOSIS — J449 Chronic obstructive pulmonary disease, unspecified: Secondary | ICD-10-CM | POA: Insufficient documentation

## 2018-05-18 DIAGNOSIS — Z87891 Personal history of nicotine dependence: Secondary | ICD-10-CM | POA: Insufficient documentation

## 2018-05-18 DIAGNOSIS — Z79899 Other long term (current) drug therapy: Secondary | ICD-10-CM | POA: Insufficient documentation

## 2018-05-18 LAB — CBC WITH DIFFERENTIAL/PLATELET
Abs Immature Granulocytes: 0.02 10*3/uL (ref 0.00–0.07)
Basophils Absolute: 0 10*3/uL (ref 0.0–0.1)
Basophils Relative: 1 %
Eosinophils Absolute: 0.1 10*3/uL (ref 0.0–0.5)
Eosinophils Relative: 2 %
HCT: 39.8 % (ref 36.0–46.0)
Hemoglobin: 12.9 g/dL (ref 12.0–15.0)
Immature Granulocytes: 0 %
Lymphocytes Relative: 40 %
Lymphs Abs: 2.1 10*3/uL (ref 0.7–4.0)
MCH: 28 pg (ref 26.0–34.0)
MCHC: 32.4 g/dL (ref 30.0–36.0)
MCV: 86.5 fL (ref 80.0–100.0)
Monocytes Absolute: 0.5 10*3/uL (ref 0.1–1.0)
Monocytes Relative: 10 %
Neutro Abs: 2.5 10*3/uL (ref 1.7–7.7)
Neutrophils Relative %: 47 %
Platelets: 249 10*3/uL (ref 150–400)
RBC: 4.6 MIL/uL (ref 3.87–5.11)
RDW: 14.6 % (ref 11.5–15.5)
WBC: 5.3 10*3/uL (ref 4.0–10.5)
nRBC: 0 % (ref 0.0–0.2)

## 2018-05-18 LAB — COMPREHENSIVE METABOLIC PANEL
ALT: 52 U/L — ABNORMAL HIGH (ref 0–44)
AST: 39 U/L (ref 15–41)
Albumin: 3.9 g/dL (ref 3.5–5.0)
Alkaline Phosphatase: 69 U/L (ref 38–126)
Anion gap: 8 (ref 5–15)
BUN: 10 mg/dL (ref 6–20)
CO2: 23 mmol/L (ref 22–32)
Calcium: 8.7 mg/dL — ABNORMAL LOW (ref 8.9–10.3)
Chloride: 109 mmol/L (ref 98–111)
Creatinine, Ser: 0.85 mg/dL (ref 0.44–1.00)
GFR calc Af Amer: >60 mL/min (ref >60)
GFR calc non Af Amer: >60 mL/min (ref >60)
Glucose, Bld: 96 mg/dL (ref 70–99)
Potassium: 3.5 mmol/L (ref 3.5–5.1)
Sodium: 140 mmol/L (ref 135–145)
Total Bilirubin: 0.4 mg/dL (ref 0.3–1.2)
Total Protein: 7.2 g/dL (ref 6.5–8.1)

## 2018-05-18 LAB — VITAMIN B12: Vitamin B-12: 710 pg/mL (ref 180–914)

## 2018-05-18 MED ORDER — HYDROXYZINE HCL 25 MG PO TABS
25.0000 mg | ORAL_TABLET | Freq: Once | ORAL | Status: AC
  Administered 2018-05-18: 25 mg via ORAL
  Filled 2018-05-18: qty 1

## 2018-05-18 MED ORDER — HYDROXYZINE PAMOATE 25 MG PO CAPS: 25.0000 mg | ORAL_CAPSULE | Freq: Four times a day (QID) | ORAL | 2 refills | Status: DC | PRN

## 2018-05-18 NOTE — Discharge Instructions (Addendum)
Follow-up with your regular doctor if not better in 3 to 5 days.  Use a lotion on your skin to keep it from being dry.  Use the medication as prescribed.  If your B12 comes back as low by an over-the-counter B12 supplement.  Make sure that you buy one that dissolves under the tongue.  Return to the emergency department if you are worsening.

## 2018-05-18 NOTE — ED Triage Notes (Signed)
Pt via pov from home with itching x 2 weeks. Denies other symptoms. Pt alert & oriented with NAD noted.

## 2018-05-18 NOTE — ED Provider Notes (Signed)
[facility] Emergency Department Provider Note  ____________________________________________   First MD Initiated Contact with Patient 05/18/18 1556     (approximate)  I have reviewed the triage vital signs and the nursing notes.   HISTORY  Chief Complaint Pruritis    HPI Brandi Swanson is a 43 y.o. female presents emergency department complaining of itching for 2 weeks.  She denies any fever or chills.  No cough or congestion.  No new exposures.  She states her father is an Conservation officer, nature and came to make sure she did have bedbugs/fleas.  She states she has had the same previously and was given a "soup.Benadryl "which did make her feel better but she does not have any more this medication left.  She states it does happen more frequently when she is stressed.    Past Medical History:  Diagnosis Date  . Acute cholecystitis due to biliary calculus 03/15/2018  . Hypertension   . Migraine with aura 08/08/2015  . Tarsal tunnel syndrome   . Tarsal tunnel syndrome of left side 08/08/2015  . Tobacco abuse 08/08/2015    Patient Active Problem List   Diagnosis Date Noted  . Migraine headache with aura 09/05/2015  . Foot pain, left 08/08/2015  . Tarsal tunnel syndrome of left side 08/08/2015  . Foot callus 08/08/2015  . Tobacco abuse 08/08/2015  . Migraine with aura 08/08/2015  . COPD (chronic obstructive pulmonary disease) ([place]) 07/04/2015  . Seasonal allergies 07/04/2015  . Acid reflux 07/04/2015  . Hypertension 07/04/2015  . Depression 07/04/2015  . Anxiety 07/04/2015    Past Surgical History:  Procedure Laterality Date  . CHOLECYSTECTOMY N/A 03/16/2018   Procedure: LAPAROSCOPIC CHOLECYSTECTOMY;  Surgeon: Vickie Epley, MD;  Location: [facility] ORS;  Service: General;  Laterality: N/A;  . FOOT SURGERY Left    X's Three  . TUBAL LIGATION      Prior to Admission medications   Medication Sig Start Date End Date Taking? Authorizing Provider   amitriptyline (ELAVIL) 50 MG tablet Take 1 tablet (50 mg total) by mouth at bedtime. 08/12/17   Erlene Quan, NP  aspirin-acetaminophen-caffeine (EXCEDRIN MIGRAINE) [phone] MG tablet Take 1 tablet by mouth every 6 (six) hours as needed for headache. 08/12/17   Tukov-Yual, Arlyss Gandy, NP  cetirizine (ZYRTEC) 10 MG tablet TAKE ONE TABLET BY MOUTH EVERY DAY. REPLACES LORATADINE Patient taking differently: Take 10 mg by mouth daily.  09/11/16   Tawni Millers, MD  cyclobenzaprine (FLEXERIL) 10 MG tablet TAKE ONE TABLET BY MOUTH 3 TIMES A DAY AS NEEDED FOR MUSCLE SPASMS Patient taking differently: Take 10 mg by mouth 3 (three) times daily as needed for muscle spasms.  09/11/16   Tawni Millers, MD  fluticasone (FLONASE) 50 MCG/ACT nasal spray Place 2 sprays into both nostrils daily as needed for up to 30 days for allergies. 05/12/18 06/11/18  Tukov-Yual, Arlyss Gandy, NP  Fluticasone-Salmeterol (ADVAIR DISKUS) 250-50 MCG/DOSE AEPB Inhale 1 puff into the lungs 2 (two) times daily. 08/12/17   Tukov-Yual, Arlyss Gandy, NP  gabapentin (NEURONTIN) 300 MG capsule Take 1 capsule (300 mg total) by mouth 3 (three) times daily. 08/12/17   Tukov-Yual, Arlyss Gandy, NP  hydrochlorothiazide (HYDRODIURIL) 25 MG tablet Take 1 tablet (25 mg total) by mouth daily. 08/12/17   Tukov-Yual, Arlyss Gandy, NP  hydrOXYzine (VISTARIL) 25 MG capsule Take 1 capsule (25 mg total) by mouth every 6 (six) hours as needed for itching. 05/18/18   Caryn Section Linden Dolin, PA-C  lactulose (  CHRONULAC) 10 GM/15ML solution TAKE 15MLS BY MOUTH 2 TIMES A DAY AS NEEDED FOR MILD CONSTIPATION 05/12/18   Tukov-Yual, Arlyss Gandy, NP  omeprazole (PRILOSEC) 20 MG capsule Take 1 capsule (20 mg total) by mouth 2 (two) times daily before a meal. 08/12/17   Tukov-Yual, Arlyss Gandy, NP  propranolol (INDERAL) 40 MG tablet Take 1 tablet (40 mg total) by mouth 2 (two) times daily. 08/12/17   Tukov-Yual, Arlyss Gandy, NP  VENTOLIN HFA 108 (90 Base) MCG/ACT inhaler INHALE 2  PUFFS INTO THE LUNGS EVERY 6 HOURS AS NEEDED FOR WHEEZING ORSHORTNESS OF BREATH. 05/12/18   Tukov-Yual, Arlyss Gandy, NP    Allergies Ibuprofen  Family History  Problem Relation Age of Onset  . COPD Mother   . Heart failure Mother   . Hypertension Mother   . Hypertension Father   . Heart disease Father   . Hypertension Sister   . Hypertension Brother     Social History Social History   Tobacco Use  . Smoking status: Former Smoker    Packs/day: 0.50    Years: 20.00    Pack years: 10.00    Types: Cigarettes    Last attempt to quit: 03/19/2018    Years since quitting: 0.1  . Smokeless tobacco: Never Used  Substance Use Topics  . Alcohol use: No    Alcohol/week: 0.0 standard drinks  . Drug use: No    Review of Systems  Constitutional: No fever/chills Eyes: No visual changes. ENT: No sore throat. Respiratory: Denies cough Genitourinary: Negative for dysuria. Musculoskeletal: Negative for back pain. Skin: Negative for rash.  Positive itching    ____________________________________________   PHYSICAL EXAM:  VITAL SIGNS: ED Triage Vitals [05/18/18 1557]  Enc Vitals Group     BP (!) 165/90     Pulse Rate 84     Resp 18     Temp 99.2 F (37.3 C)     Temp Source Oral     SpO2 100 %     Weight 199 lb (90.3 kg)     Height 5' 4"  (1.626 m)     Head Circumference      Peak Flow      Pain Score 0     Pain Loc      Pain Edu?      Excl. in [place]?     Constitutional: Alert and oriented. Well appearing and in no acute distress. Eyes: Conjunctivae are normal.  Head: Atraumatic. Nose: No congestion/rhinnorhea. Mouth/Throat: Mucous membranes are moist.   Neck:  supple no lymphadenopathy noted Cardiovascular: Normal rate, regular rhythm. Heart sounds are normal Respiratory: Normal respiratory effort.  No retractions, lungs c t a  GU: deferred Musculoskeletal: FROM all extremities, warm and well perfused Neurologic:  Normal speech and language.  Skin:  Skin is warm,  dry and intact. No rash noted. Psychiatric: Mood and affect are normal. Speech and behavior are normal.  ____________________________________________   LABS (all labs ordered are listed, but only abnormal results are displayed)  Labs Reviewed  COMPREHENSIVE METABOLIC PANEL - Abnormal; Notable for the following components:      Result Value   Calcium 8.7 (*)    ALT 52 (*)    All other components within normal limits  CBC WITH DIFFERENTIAL/PLATELET  VITAMIN B12   ____________________________________________   ____________________________________________  RADIOLOGY    ____________________________________________   PROCEDURES  Procedure(s) performed: Atarax 25 mg p.o.   Procedures    ____________________________________________   INITIAL IMPRESSION / ASSESSMENT AND PLAN /  ED COURSE  Pertinent labs & imaging results that were available during my care of the patient were reviewed by me and considered in my medical decision making (see chart for details).   Patient is 43 year old female presents emergency department with complaints of itching for 2 weeks.  No new exposures.  Physical exam shows patient to be a little anxious and scratching.  No lesions for scabies or bedbugs are noted.  The remainder the exam is unremarkable.  CBC and met C to rule out systemic problems.  These are both normal findings.  B12 is pending.  Explained all of these to the patient.  She is to follow-up with regular doctor if not better in 1 week.  If her B12 ends up being low she will need to buy an over-the-counter B12 supplement.  She states she understands and will comply.  She was given a prescription for Atarax 25 mg 3 times daily.  She was discharged in stable condition.     As part of my medical decision making, I reviewed the following data within the [facility] notes reviewed and incorporated, Labs reviewed CBC and met C are normal, Old chart reviewed, Notes  from prior ED visits and [place] Controlled Substance Database  ____________________________________________   FINAL CLINICAL IMPRESSION(S) / ED DIAGNOSES  Final diagnoses:  Itching      NEW MEDICATIONS STARTED DURING THIS VISIT:  Current Discharge Medication List       Note:  This document was prepared using Dragon voice recognition software and may include unintentional dictation errors.    Versie Starks, PA-C 05/18/18 1651    Nance Pear, MD 05/18/18 [phone]

## 2018-11-01 ENCOUNTER — Telehealth: Payer: Self-pay | Admitting: Pharmacy Technician

## 2018-11-01 NOTE — Telephone Encounter (Signed)
Patient failed to provide 2020 poi.  Also, patient has not requested medication assistance from [facility] since March 2020.  [facility]

## 2019-10-15 ENCOUNTER — Emergency Department: Payer: Self-pay

## 2019-10-15 ENCOUNTER — Other Ambulatory Visit: Payer: Self-pay

## 2019-10-15 ENCOUNTER — Encounter: Payer: Self-pay | Admitting: Emergency Medicine

## 2019-10-15 ENCOUNTER — Emergency Department
Admission: EM | Admit: 2019-10-15 | Discharge: 2019-10-15 | Disposition: A | Discharge status: Home / Self Care | Payer: Self-pay | Attending: Emergency Medicine | Admitting: Emergency Medicine

## 2019-10-15 DIAGNOSIS — Z7982 Long term (current) use of aspirin: Secondary | ICD-10-CM | POA: Insufficient documentation

## 2019-10-15 DIAGNOSIS — R252 Cramp and spasm: Secondary | ICD-10-CM

## 2019-10-15 DIAGNOSIS — N179 Acute kidney failure, unspecified: Secondary | ICD-10-CM | POA: Insufficient documentation

## 2019-10-15 DIAGNOSIS — R000 Tachycardia, unspecified: Secondary | ICD-10-CM | POA: Insufficient documentation

## 2019-10-15 DIAGNOSIS — E860 Dehydration: Secondary | ICD-10-CM | POA: Insufficient documentation

## 2019-10-15 DIAGNOSIS — I10 Essential (primary) hypertension: Secondary | ICD-10-CM | POA: Insufficient documentation

## 2019-10-15 DIAGNOSIS — F1590 Other stimulant use, unspecified, uncomplicated: Secondary | ICD-10-CM | POA: Insufficient documentation

## 2019-10-15 DIAGNOSIS — Z79899 Other long term (current) drug therapy: Secondary | ICD-10-CM | POA: Insufficient documentation

## 2019-10-15 DIAGNOSIS — J449 Chronic obstructive pulmonary disease, unspecified: Secondary | ICD-10-CM | POA: Insufficient documentation

## 2019-10-15 DIAGNOSIS — Z87891 Personal history of nicotine dependence: Secondary | ICD-10-CM | POA: Insufficient documentation

## 2019-10-15 LAB — CBC WITH DIFFERENTIAL/PLATELET
Abs Immature Granulocytes: 0.02 10*3/uL (ref 0.00–0.07)
Basophils Absolute: 0 10*3/uL (ref 0.0–0.1)
Basophils Relative: 1 %
Eosinophils Absolute: 0 10*3/uL (ref 0.0–0.5)
Eosinophils Relative: 0 %
HCT: 42.8 % (ref 36.0–46.0)
Hemoglobin: 14.9 g/dL (ref 12.0–15.0)
Immature Granulocytes: 0 %
Lymphocytes Relative: 28 %
Lymphs Abs: 1.8 10*3/uL (ref 0.7–4.0)
MCH: 28.2 pg (ref 26.0–34.0)
MCHC: 34.8 g/dL (ref 30.0–36.0)
MCV: 81.1 fL (ref 80.0–100.0)
Monocytes Absolute: 1 10*3/uL (ref 0.1–1.0)
Monocytes Relative: 16 %
Neutro Abs: 3.6 10*3/uL (ref 1.7–7.7)
Neutrophils Relative %: 55 %
Platelets: 177 10*3/uL (ref 150–400)
RBC: 5.28 MIL/uL — ABNORMAL HIGH (ref 3.87–5.11)
RDW: 14.7 % (ref 11.5–15.5)
WBC: 6.5 10*3/uL (ref 4.0–10.5)
nRBC: 0 % (ref 0.0–0.2)

## 2019-10-15 LAB — ETHANOL: Alcohol, Ethyl (B): <10 mg/dL (ref <10)

## 2019-10-15 LAB — LIPASE, BLOOD: Lipase: 20 U/L (ref 11–51)

## 2019-10-15 LAB — COMPREHENSIVE METABOLIC PANEL
ALT: 36 U/L (ref 0–44)
AST: 37 U/L (ref 15–41)
Albumin: 4.4 g/dL (ref 3.5–5.0)
Alkaline Phosphatase: 60 U/L (ref 38–126)
Anion gap: 14 (ref 5–15)
BUN: 19 mg/dL (ref 6–20)
CO2: 17 mmol/L — ABNORMAL LOW (ref 22–32)
Calcium: 8.2 mg/dL — ABNORMAL LOW (ref 8.9–10.3)
Chloride: 108 mmol/L (ref 98–111)
Creatinine, Ser: 1.29 mg/dL — ABNORMAL HIGH (ref 0.44–1.00)
GFR calc Af Amer: 58 mL/min — ABNORMAL LOW (ref >60)
GFR calc non Af Amer: 50 mL/min — ABNORMAL LOW (ref >60)
Glucose, Bld: 110 mg/dL — ABNORMAL HIGH (ref 70–99)
Potassium: 3.8 mmol/L (ref 3.5–5.1)
Sodium: 139 mmol/L (ref 135–145)
Total Bilirubin: 0.8 mg/dL (ref 0.3–1.2)
Total Protein: 7.9 g/dL (ref 6.5–8.1)

## 2019-10-15 LAB — CK: Total CK: 392 U/L — ABNORMAL HIGH (ref 38–234)

## 2019-10-15 LAB — TROPONIN I (HIGH SENSITIVITY)
Troponin I (High Sensitivity): 23 ng/L — ABNORMAL HIGH (ref <18)
Troponin I (High Sensitivity): 29 ng/L — ABNORMAL HIGH (ref <18)

## 2019-10-15 LAB — MAGNESIUM: Magnesium: 1.9 mg/dL (ref 1.7–2.4)

## 2019-10-15 LAB — FIBRIN DERIVATIVES D-DIMER (ARMC ONLY): Fibrin derivatives D-dimer (ARMC): 574.48 ng/mL (FEU) — ABNORMAL HIGH (ref 0.00–499.00)

## 2019-10-15 MED ORDER — MIDAZOLAM HCL 2 MG/2ML IJ SOLN
2.0000 mg | Freq: Once | INTRAMUSCULAR | Status: AC
  Administered 2019-10-15: 2 mg via INTRAVENOUS
  Filled 2019-10-15: qty 2

## 2019-10-15 MED ORDER — SODIUM CHLORIDE 0.9 % IV BOLUS
1000.0000 mL | Freq: Once | INTRAVENOUS | Status: AC
  Administered 2019-10-15: 1000 mL via INTRAVENOUS

## 2019-10-15 MED ORDER — DIAZEPAM 2 MG PO TABS: 2.0000 mg | ORAL_TABLET | Freq: Three times a day (TID) | ORAL | 0 refills | Status: DC | PRN

## 2019-10-15 NOTE — ED Notes (Signed)
Pt appears calm reports muscle cramps subsided about 15 min after versed, lights dimmed, EDP notified

## 2019-10-15 NOTE — ED Provider Notes (Signed)
[facility] Emergency Department Provider Note   ____________________________________________   First MD Initiated Contact with Patient 10/15/19 [phone]     (approximate)  I have reviewed the triage vital signs and the nursing notes.   HISTORY  Chief Complaint Shortness of Breath    HPI Brandi Swanson is a 44 y.o. female brought to the ED via EMS from home with a chief complaint of muscle cramps.  Patient has been outdoors all day celebrating her 18th wedding anniversary and complains of generalized muscle cramps since 7 PM.  She is currently holding the back of her right thigh due to cramping.  Also experiencing cramping to her right upper quadrant which is making her bent over and feeling short of breath with worsening pain on deep breathing.  Denies recent fever, cough, nausea, vomiting or dizziness.       Past Medical History:  Diagnosis Date  . Acute cholecystitis due to biliary calculus 03/15/2018  . Hypertension   . Migraine with aura 08/08/2015  . Tarsal tunnel syndrome   . Tarsal tunnel syndrome of left side 08/08/2015  . Tobacco abuse 08/08/2015    Patient Active Problem List   Diagnosis Date Noted  . Migraine headache with aura 09/05/2015  . Foot pain, left 08/08/2015  . Tarsal tunnel syndrome of left side 08/08/2015  . Foot callus 08/08/2015  . Tobacco abuse 08/08/2015  . Migraine with aura 08/08/2015  . COPD (chronic obstructive pulmonary disease) ([facility]) 07/04/2015  . Seasonal allergies 07/04/2015  . Acid reflux 07/04/2015  . Hypertension 07/04/2015  . Depression 07/04/2015  . Anxiety 07/04/2015    Past Surgical History:  Procedure Laterality Date  . CHOLECYSTECTOMY N/A 03/16/2018   Procedure: LAPAROSCOPIC CHOLECYSTECTOMY;  Surgeon: Ancil Linsey, MD;  Location: [facility] ORS;  Service: General;  Laterality: N/A;  . FOOT SURGERY Left    X's Three  . TUBAL LIGATION      Prior to Admission medications   Medication Sig Start  Date End Date Taking? Authorizing Provider  amitriptyline (ELAVIL) 50 MG tablet Take 1 tablet (50 mg total) by mouth at bedtime. 08/12/17   Andreas Ohm, NP  aspirin-acetaminophen-caffeine (EXCEDRIN MIGRAINE) [phone] MG tablet Take 1 tablet by mouth every 6 (six) hours as needed for headache. 08/12/17   Tukov-Yual, Alroy Bailiff, NP  cetirizine (ZYRTEC) 10 MG tablet TAKE ONE TABLET BY MOUTH EVERY DAY. REPLACES LORATADINE Patient taking differently: Take 10 mg by mouth daily.  09/11/16   Virl Axe, MD  cyclobenzaprine (FLEXERIL) 10 MG tablet TAKE ONE TABLET BY MOUTH 3 TIMES A DAY AS NEEDED FOR MUSCLE SPASMS Patient taking differently: Take 10 mg by mouth 3 (three) times daily as needed for muscle spasms.  09/11/16   Virl Axe, MD  diazepam (VALIUM) 2 MG tablet Take 1 tablet (2 mg total) by mouth every 8 (eight) hours as needed for muscle spasms. 10/15/19   Irean Hong, MD  fluticasone (FLONASE) 50 MCG/ACT nasal spray Place 2 sprays into both nostrils daily as needed for up to 30 days for allergies. 05/12/18 06/11/18  Tukov-Yual, Alroy Bailiff, NP  Fluticasone-Salmeterol (ADVAIR DISKUS) 250-50 MCG/DOSE AEPB Inhale 1 puff into the lungs 2 (two) times daily. 08/12/17   Tukov-Yual, Alroy Bailiff, NP  gabapentin (NEURONTIN) 300 MG capsule Take 1 capsule (300 mg total) by mouth 3 (three) times daily. 08/12/17   Tukov-Yual, Alroy Bailiff, NP  hydrochlorothiazide (HYDRODIURIL) 25 MG tablet Take 1 tablet (25 mg total) by mouth daily. 08/12/17  Tukov-Yual, Magdalene S, NP  hydrOXYzine (VISTARIL) 25 MG capsule Take 1 capsule (25 mg total) by mouth every 6 (six) hours as needed for itching. 05/18/18   Fisher, Roselyn Bering, PA-C  lactulose (CHRONULAC) 10 GM/15ML solution TAKE BY MOUTH 2 TIMES A DAY AS NEEDED FOR MILD CONSTIPATION 05/12/18   Tukov-Yual, Alroy Bailiff, NP  omeprazole (PRILOSEC) 20 MG capsule Take 1 capsule (20 mg total) by mouth 2 (two) times daily before a meal. 08/12/17   Tukov-Yual, Alroy Bailiff,  NP  propranolol (INDERAL) 40 MG tablet Take 1 tablet (40 mg total) by mouth 2 (two) times daily. 08/12/17   Tukov-Yual, Alroy Bailiff, NP  VENTOLIN HFA 108 (90 Base) MCG/ACT inhaler INHALE 2 PUFFS INTO THE LUNGS EVERY 6 HOURS AS NEEDED FOR WHEEZING ORSHORTNESS OF BREATH. 05/12/18   Tukov-Yual, Alroy Bailiff, NP    Allergies Ibuprofen  Family History  Problem Relation Age of Onset  . COPD Mother   . Heart failure Mother   . Hypertension Mother   . Hypertension Father   . Heart disease Father   . Hypertension Sister   . Hypertension Brother     Social History Social History   Tobacco Use  . Smoking status: Former Smoker    Packs/day: 0.50    Years: 20.00    Pack years: 10.00    Types: Cigarettes    Quit date: 03/19/2018    Years since quitting: 1.5  . Smokeless tobacco: Never Used  Substance Use Topics  . Alcohol use: No    Alcohol/week: 0.0 standard drinks  . Drug use: Yes    Types: Marijuana    Review of Systems  Constitutional: No fever/chills Eyes: No visual changes. ENT: No sore throat. Cardiovascular: Positive for chest pain. Respiratory: Positive for shortness of breath. Gastrointestinal: No abdominal pain.  No nausea, no vomiting.  No diarrhea.  No constipation. Genitourinary: Negative for dysuria. Musculoskeletal: Positive for cramping.  Negative for back pain. Skin: Negative for rash. Neurological: Negative for headaches, focal weakness or numbness.   ____________________________________________   PHYSICAL EXAM:  VITAL SIGNS: ED Triage Vitals  Enc Vitals Group     BP 10/15/19 0220 (!) 173/100     Pulse Rate 10/15/19 0220 (!) 130     Resp 10/15/19 0220 (!) 28     Temp --      Temp src --      SpO2 10/15/19 0220 98 %     Weight 10/15/19 0223 190 lb (86.2 kg)     Height 10/15/19 0223 5\' 5"  (1.651 m)     Head Circumference --      Peak Flow --      Pain Score 10/15/19 0220 10     Pain Loc --      Pain Edu? --      Excl. in [place]? --      Constitutional: Alert and oriented.  Tearful appearing and in mild acute distress. Eyes: Conjunctivae are normal. PERRL. EOMI. Head: Atraumatic. Nose: No congestion/rhinnorhea. Mouth/Throat: Mucous membranes are mildly dry.   Neck: No stridor.   Cardiovascular: Tachycardic rate, regular rhythm. Grossly normal heart sounds.  Good peripheral circulation. Respiratory: Increased respiratory effort.  No retractions. Lungs CTAB. Gastrointestinal: Soft and nontender. No distention. No abdominal bruits. No CVA tenderness. Musculoskeletal: BLE muscle cramps.  No edema.  No joint effusions. Neurologic:  Normal speech and language. No gross focal neurologic deficits are appreciated.  Skin:  Skin is warm, dry and intact. No rash noted. Psychiatric: Mood  and affect are normal. Speech and behavior are normal.  ____________________________________________   LABS (all labs ordered are listed, but only abnormal results are displayed)  Labs Reviewed  COMPREHENSIVE METABOLIC PANEL - Abnormal; Notable for the following components:      Result Value   CO2 17 (*)    Glucose, Bld 110 (*)    Creatinine, Ser 1.29 (*)    Calcium 8.2 (*)    GFR calc non Af Amer 50 (*)    GFR calc Af Amer 58 (*)    All other components within normal limits  CBC WITH DIFFERENTIAL/PLATELET - Abnormal; Notable for the following components:   RBC 5.28 (*)    All other components within normal limits  CK - Abnormal; Notable for the following components:   Total CK 392 (*)    All other components within normal limits  FIBRIN DERIVATIVES D-DIMER ([facility] ONLY) - Abnormal; Notable for the following components:   Fibrin derivatives D-dimer ([facility]) 574.48 (*)    All other components within normal limits  TROPONIN I (HIGH SENSITIVITY) - Abnormal; Notable for the following components:   Troponin I (High Sensitivity) 23 (*)    All other components within normal limits  TROPONIN I (HIGH SENSITIVITY) - Abnormal; Notable for the  following components:   Troponin I (High Sensitivity) 29 (*)    All other components within normal limits  MAGNESIUM  LIPASE, BLOOD  ETHANOL   ____________________________________________  EKG  ED ECG REPORT I, Nygeria Lager J, the attending physician, personally viewed and interpreted this ECG.   Date: 10/15/2019  EKG Time: 0223  Rate: 123  Rhythm: sinus tachycardia  Axis: Normal  Intervals:none  ST&T Change: Nonspecific  ____________________________________________  RADIOLOGY  ED MD interpretation: No acute cardiopulmonary process  Official radiology report(s): DG Chest Port 1 View  Result Date: 10/15/2019 CLINICAL DATA:  44 year old female with shortness of breath and chest pain. EXAM: PORTABLE CHEST 1 VIEW COMPARISON:  Chest radiograph dated 06/24/2015. FINDINGS: There is mild interstitial prominence which may be chronic. Atypical infection is less likely but not excluded clinical correlation is recommended. No focal consolidation, pleural effusion, pneumothorax. The cardiac silhouette is within limits. No acute osseous pathology. IMPRESSION: No focal consolidation. Electronically Signed   By: Elgie Collard M.D.   On: 10/15/2019 03:17    ____________________________________________   PROCEDURES  Procedure(s) performed (including Critical Care):  .1-3 Lead EKG Interpretation Performed by: Irean Hong, MD Authorized by: Irean Hong, MD     Interpretation: abnormal     ECG rate:  114   ECG rate assessment: tachycardic     Rhythm: sinus tachycardia     Ectopy: none     Conduction: normal   Comments:     Patient placed on cardiac monitor to evaluate for arrhythmias     ____________________________________________   INITIAL IMPRESSION / ASSESSMENT AND PLAN / ED COURSE  As part of my medical decision making, I reviewed the following data within the electronic MEDICAL RECORD NUMBER Nursing notes reviewed and incorporated, Labs reviewed, EKG interpreted, Old chart  reviewed, Radiograph reviewed  and Notes from prior ED visits     Brandi Swanson was evaluated in Emergency Department on 10/15/2019 for the symptoms described in the history of present illness. She was evaluated in the context of the global COVID-19 pandemic, which necessitated consideration that the patient might be at risk for infection with the SARS-CoV-2 virus that causes COVID-19. Institutional protocols and algorithms that pertain to the evaluation of patients  at risk for COVID-19 are in a state of rapid change based on information released by regulatory bodies including the CDC and federal and state organizations. These policies and algorithms were followed during the patient's care in the ED.    44 year old female presenting with generalized muscle cramps.  Differential diagnosis includes but is not limited to dehydration, metabolic, electrolyte abnormalities, etc.  We will obtain basic lab work, initiate IV hydration, IV Versed for muscle cramps.  Mustard provided for cramps.   Clinical Course as of Oct 15 623  Sun Oct 15, 2019  0400 Cramping significantly better.  Noted elevated D-dimer; however, low suspicion for PE as her right upper quadrant/chest cramps have also resolved.  Will repeat troponin.   [JS]  0546 Repeat troponin not significantly changed.  Feeling better.  Will discharge her home on Valium to use as needed for muscles cramps.  Strict return precautions given.  Patient verbalizes understanding and agrees with plan of care.   [JS]    Clinical Course User Index [JS] Irean HongSung, Aireonna Bauer J, MD     ____________________________________________   FINAL CLINICAL IMPRESSION(S) / ED DIAGNOSES  Final diagnoses:  Cramps, muscle, general  AKI (acute kidney injury) ([facility])  Dehydration     ED Discharge Orders         Ordered    diazepam (VALIUM) 2 MG tablet  Every 8 hours PRN        10/15/19 0557           Note:  This document was prepared using Dragon voice  recognition software and may include unintentional dictation errors.   Irean HongSung, Wenceslaus Gist J, MD 10/15/19 [phone]

## 2019-10-15 NOTE — Discharge Instructions (Addendum)
1.  Drink plenty of fluids daily. 2.  You may take Valium as needed for muscle cramps. 3.  Return to the ER for worsening symptoms, persistent vomiting, difficulty breathing or other concerns.

## 2019-10-15 NOTE — ED Triage Notes (Addendum)
Patient presents to Emergency Department via [facility] fromhome with complaints of SOB and CP.  EMS found pt c/o of muscle cramps, sweating, RUQ pain worse with breathing, hx of gall bladder removal in 2019, initial BP 95/75 NS bolus and then BP 133/103  Pt denies poor hydration, pt tearful

## 2020-01-29 ENCOUNTER — Ambulatory Visit: Payer: Self-pay | Attending: Internal Medicine

## 2020-01-29 DIAGNOSIS — Z23 Encounter for immunization: Secondary | ICD-10-CM

## 2020-01-29 NOTE — Progress Notes (Signed)
   Covid-19 Vaccination Clinic  Name:  Brandi Swanson    MRN: [id number] DOB: 1975-06-09  01/29/2020  [name] was observed post Covid-19 immunization for 15 minutes without incident. She was provided with Vaccine Information Sheet and instruction to access the V-Safe system.   [name] was instructed to call 911 with any severe reactions post vaccine: Marland Kitchen Difficulty breathing  . Swelling of face and throat  . A fast heartbeat  . A bad rash all over body  . Dizziness and weakness   Immunizations Administered    Name Date Dose VIS Date Route   Moderna COVID-19 Vaccine 01/29/2020  5:08 PM 0.5 mL 12/06/2019 Intramuscular   Manufacturer: Gala Murdoch   Lot: [id number]   NDC: 41583-094-07

## 2020-02-26 ENCOUNTER — Ambulatory Visit: Payer: Medicaid Other

## 2020-05-07 NOTE — Telephone Encounter (Signed)
To close the telephone encounter 

## 2020-11-03 IMAGING — DX DG CHEST 1V PORT
1 series · 1 of 1 positions shown · non-contrast
Comparison: Chest radiograph dated 06/24/2015.

CLINICAL DATA: 44-year-old female with shortness of breath and
chest pain.

EXAM:
PORTABLE CHEST 1 VIEW

[chest ap]
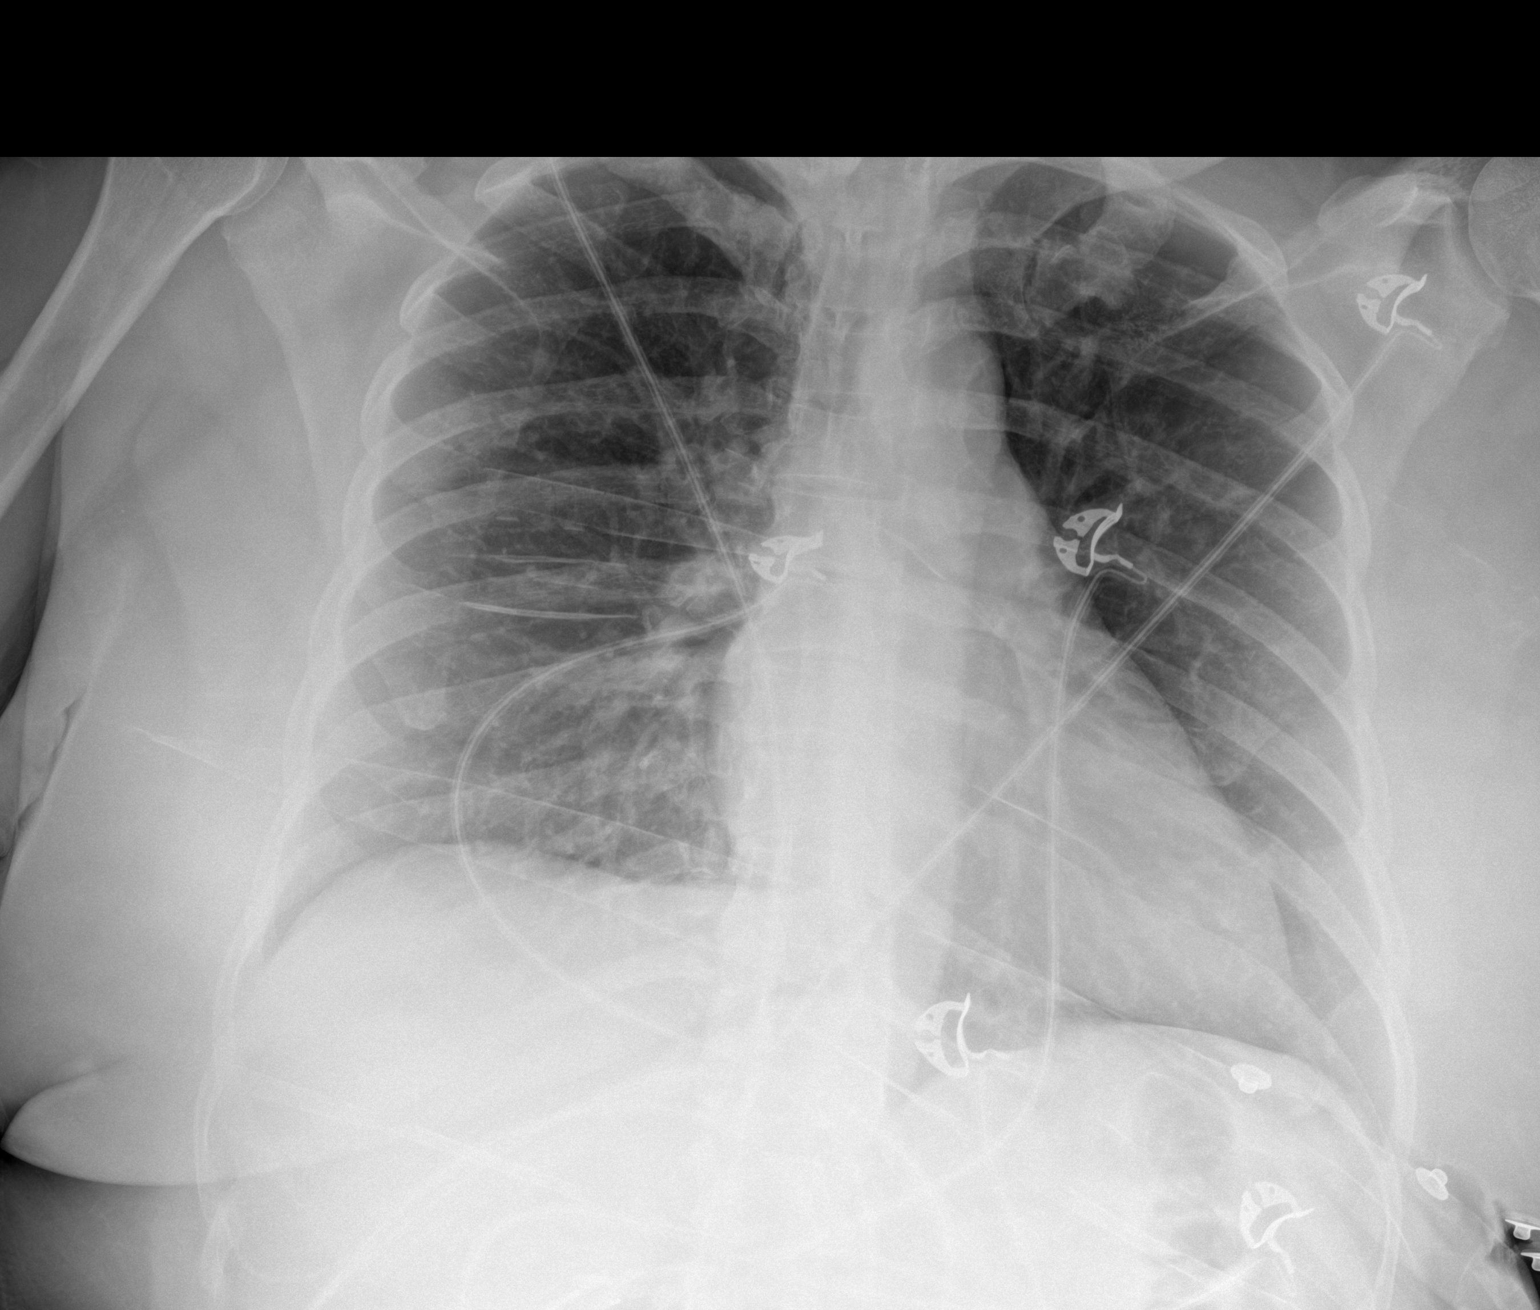

[1 of 1 positions shown; findings below may reference images not displayed]

FINDINGS: There is mild interstitial prominence which may be chronic. Atypical
infection is less likely but not excluded clinical correlation is
recommended. No focal consolidation, pleural effusion, pneumothorax.
The cardiac silhouette is within limits. No acute osseous pathology.
IMPRESSION: No focal consolidation.

## 2020-12-16 IMAGING — US US ABDOMEN LIMITED
1 series · 14 of 25 positions shown · non-contrast
Comparison: 08/27/2008

CLINICAL DATA: Severe epigastric pain with nausea for 8 hours

EXAM:
ULTRASOUND ABDOMEN LIMITED RIGHT UPPER QUADRANT

[Series 1: us abdomen limited · 0.22mm/px · 14 of 43 slices shown]
[im 1/43]
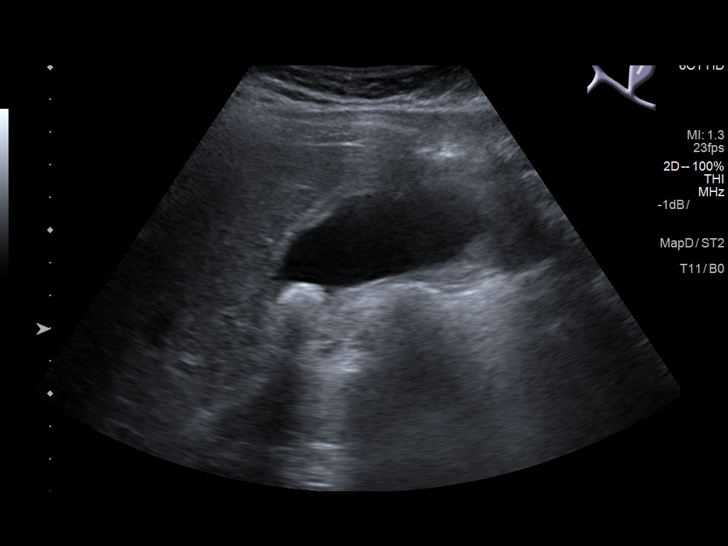
[im 4/43]
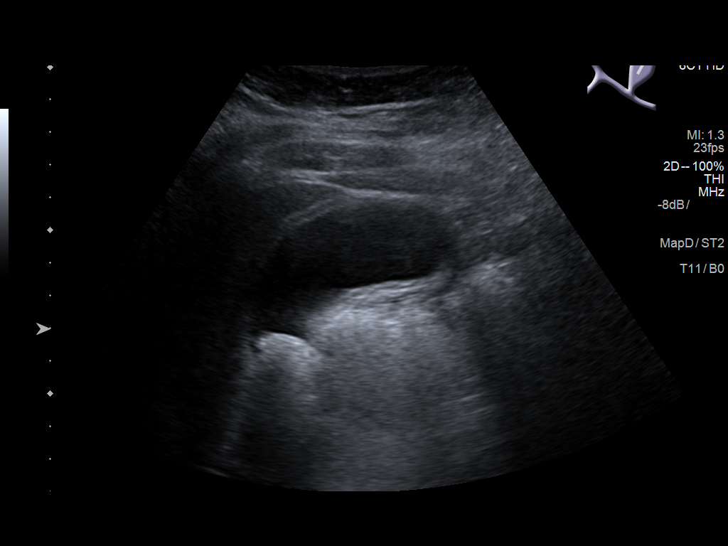
[im 8/43]
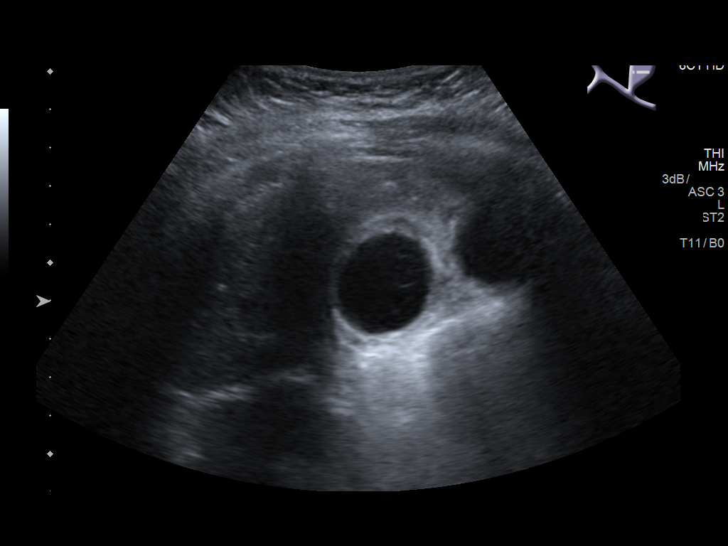
[im 11/43]
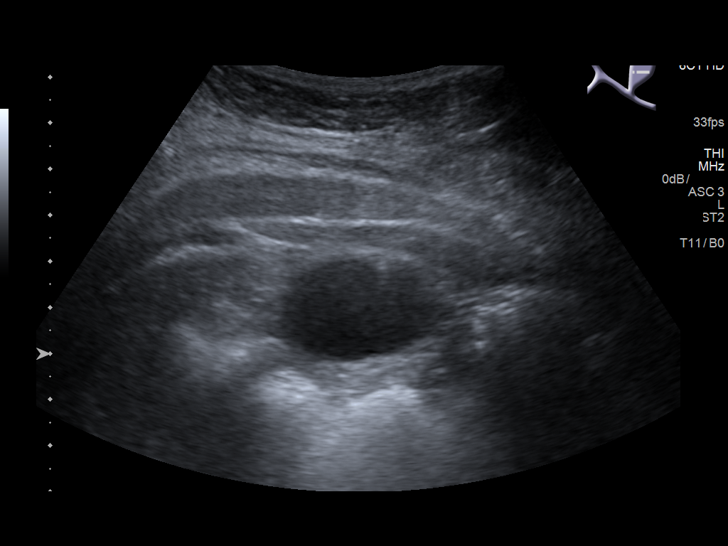
[im 15/43]
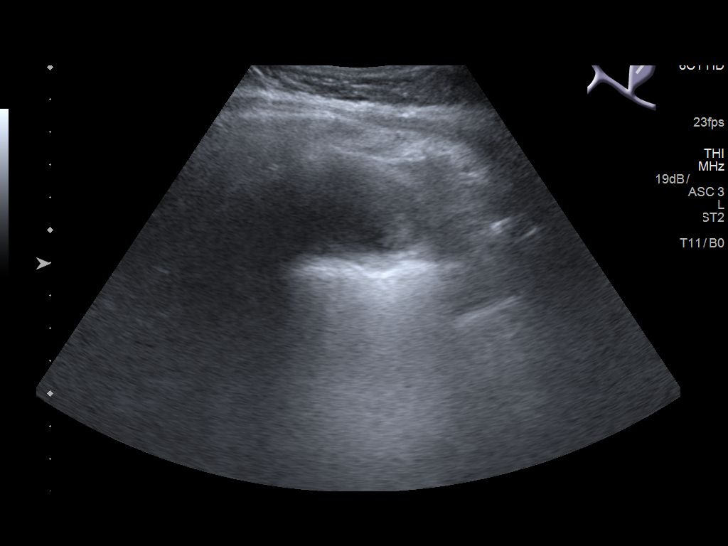
[im 16/43]
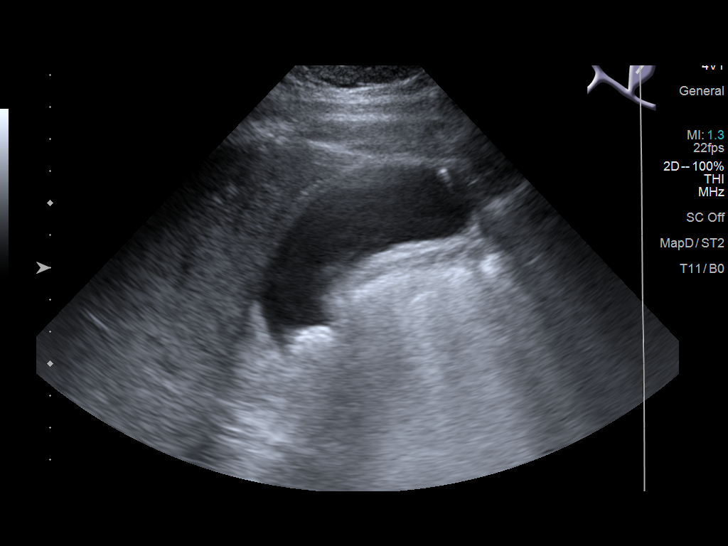
[im 20/43]
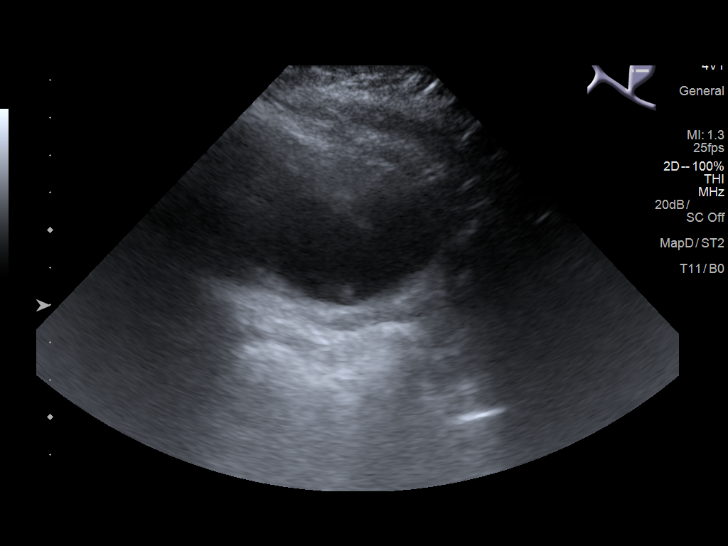
[im 23/43]
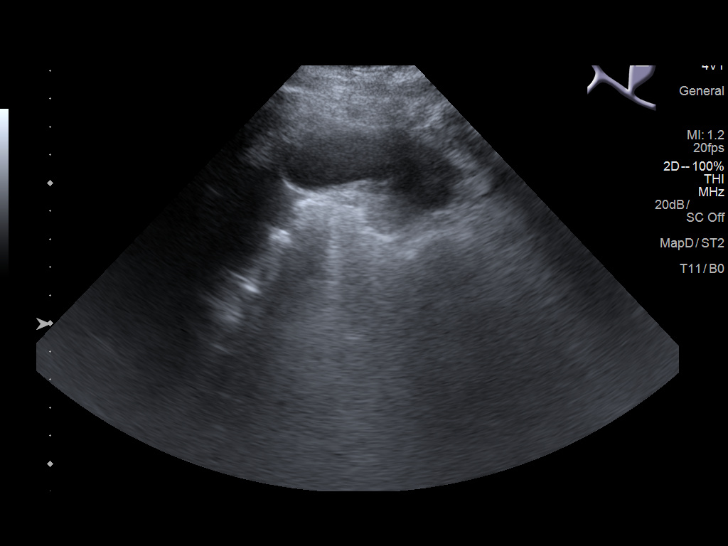
[im 27/43]
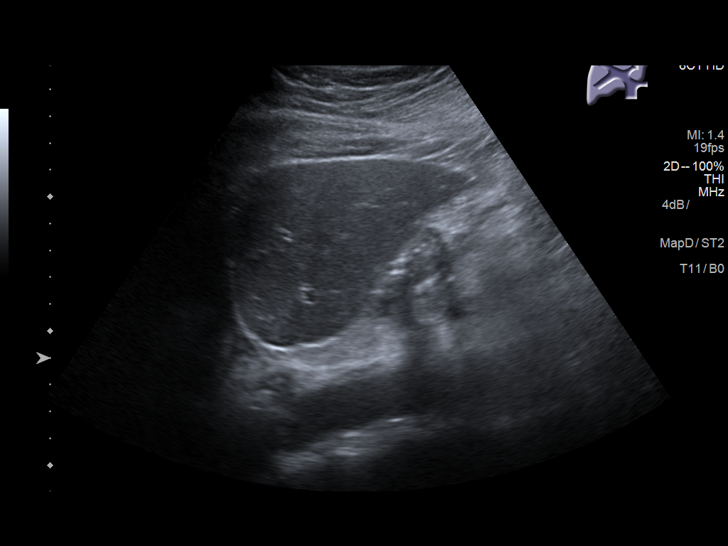
[im 29/43]
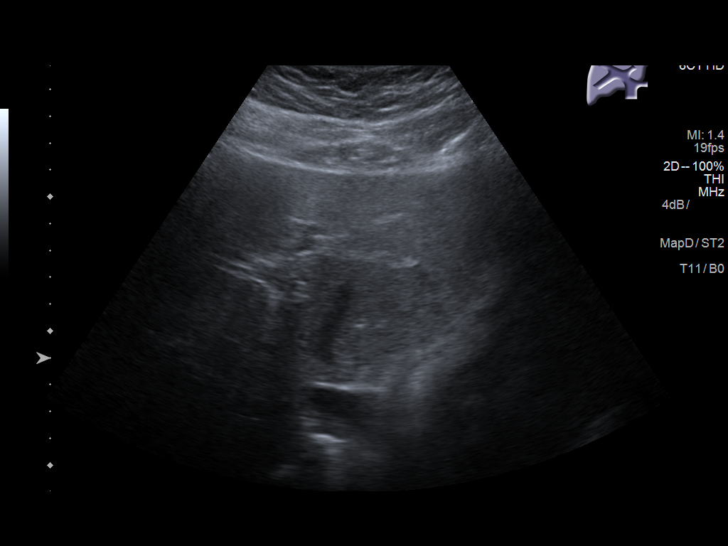
[im 32/43]
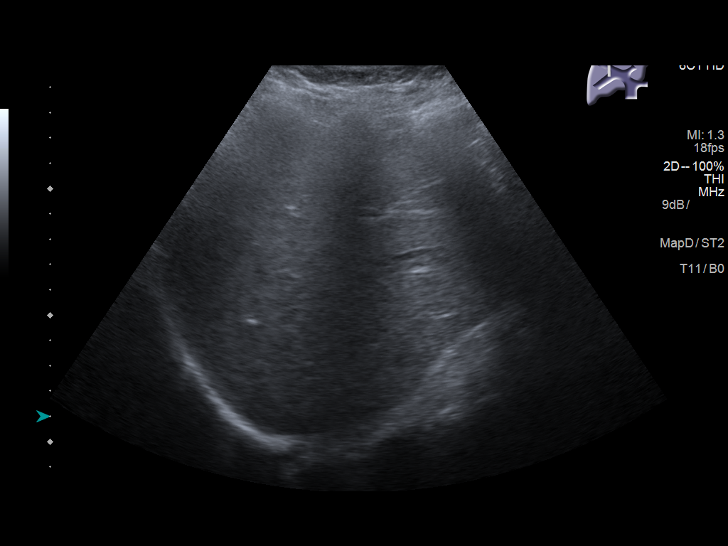
[im 36/43]
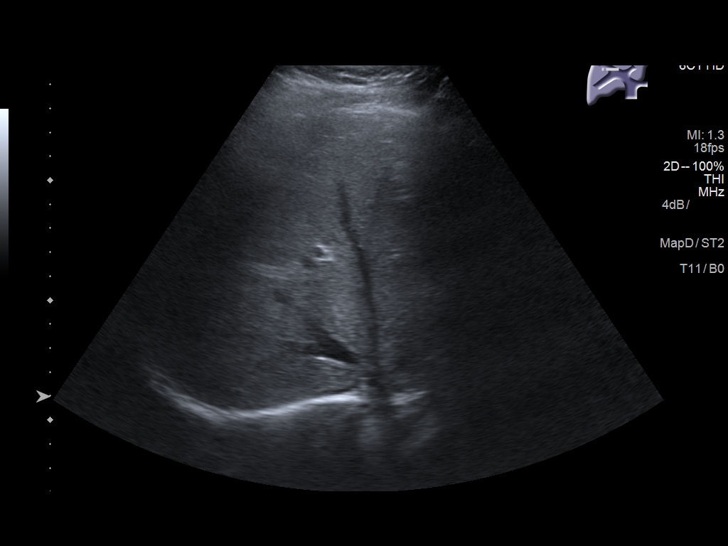
[im 39/43]
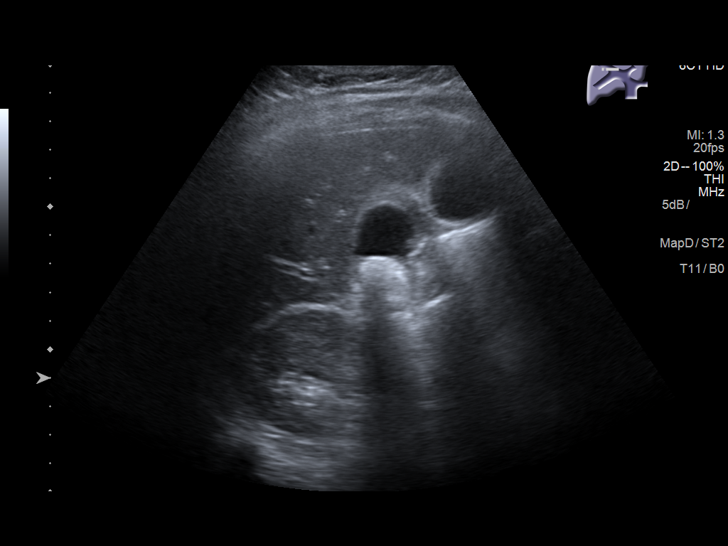
[im 43/43]
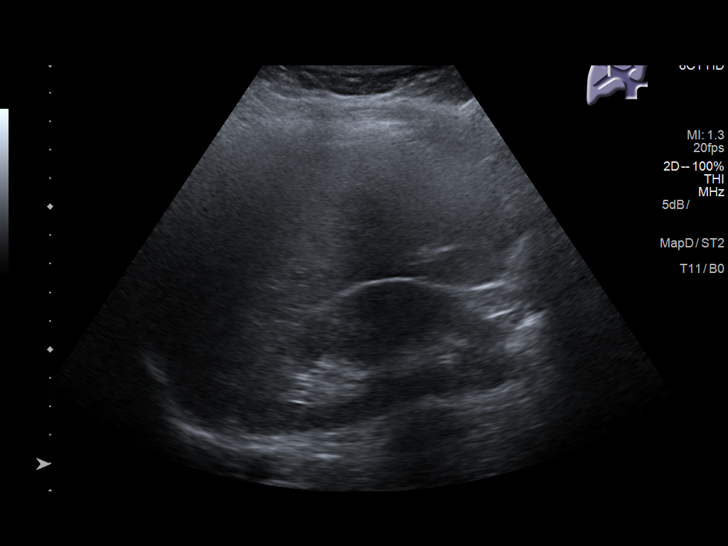

[14 of 25 positions shown; findings below may reference images not displayed]

FINDINGS: Gallbladder:

Gallstone fixed in the gallbladder neck. The gallbladder wall is
thickened and mildly striated. Ring down artifact consistent with
adenomyomatosis. Trace pericholecystic fluid. Positive sonographic
Murphy sign.

11 mm area of mid level echoes at the gallbladder fundus that does
not appear to have a stalk, most consistent with sludge. Color
Doppler was not applied.

Common bile duct:

Diameter: 4 mm.  Where visualized, no filling defect.

Liver:

No focal lesion identified. Within normal limits in parenchymal
echogenicity. Portal vein is patent on color Doppler imaging with
normal direction of blood flow towards the liver.
IMPRESSION: 1. Findings of acute calculus cholecystitis.
2. Adenomyomatosis and probable gallbladder sludge.

## 2022-01-13 ENCOUNTER — Emergency Department
Admission: EM | Admit: 2022-01-13 | Discharge: 2022-01-13 | Disposition: A | Discharge status: Home / Self Care | Payer: Medicaid Other | Attending: Emergency Medicine | Admitting: Emergency Medicine

## 2022-01-13 ENCOUNTER — Emergency Department: Payer: Medicaid Other

## 2022-01-13 ENCOUNTER — Encounter: Payer: Self-pay | Admitting: Emergency Medicine

## 2022-01-13 ENCOUNTER — Other Ambulatory Visit: Payer: Self-pay

## 2022-01-13 DIAGNOSIS — R0789 Other chest pain: Secondary | ICD-10-CM | POA: Insufficient documentation

## 2022-01-13 DIAGNOSIS — I10 Essential (primary) hypertension: Secondary | ICD-10-CM | POA: Insufficient documentation

## 2022-01-13 DIAGNOSIS — G43809 Other migraine, not intractable, without status migrainosus: Secondary | ICD-10-CM

## 2022-01-13 DIAGNOSIS — F17200 Nicotine dependence, unspecified, uncomplicated: Secondary | ICD-10-CM | POA: Insufficient documentation

## 2022-01-13 DIAGNOSIS — R519 Headache, unspecified: Secondary | ICD-10-CM | POA: Insufficient documentation

## 2022-01-13 LAB — CBC
HCT: 41.5 % (ref 36.0–46.0)
Hemoglobin: 13.4 g/dL (ref 12.0–15.0)
MCH: 27.2 pg (ref 26.0–34.0)
MCHC: 32.3 g/dL (ref 30.0–36.0)
MCV: 84.2 fL (ref 80.0–100.0)
Platelets: 212 10*3/uL (ref 150–400)
RBC: 4.93 MIL/uL (ref 3.87–5.11)
RDW: 14.8 % (ref 11.5–15.5)
WBC: 5.2 10*3/uL (ref 4.0–10.5)
nRBC: 0 % (ref 0.0–0.2)

## 2022-01-13 LAB — BASIC METABOLIC PANEL
Anion gap: 6 (ref 5–15)
BUN: 14 mg/dL (ref 6–20)
CO2: 22 mmol/L (ref 22–32)
Calcium: 9 mg/dL (ref 8.9–10.3)
Chloride: 112 mmol/L — ABNORMAL HIGH (ref 98–111)
Creatinine, Ser: 0.9 mg/dL (ref 0.44–1.00)
GFR, Estimated: >60 mL/min (ref >60)
Glucose, Bld: 102 mg/dL — ABNORMAL HIGH (ref 70–99)
Potassium: 3.4 mmol/L — ABNORMAL LOW (ref 3.5–5.1)
Sodium: 140 mmol/L (ref 135–145)

## 2022-01-13 LAB — TROPONIN I (HIGH SENSITIVITY): Troponin I (High Sensitivity): 12 ng/L (ref <18)

## 2022-01-13 MED ORDER — METOCLOPRAMIDE HCL 5 MG/ML IJ SOLN
10.0000 mg | Freq: Once | INTRAMUSCULAR | Status: AC
  Administered 2022-01-13: 10 mg via INTRAVENOUS
  Filled 2022-01-13: qty 2

## 2022-01-13 MED ORDER — ACETAMINOPHEN 500 MG PO TABS
1000.0000 mg | ORAL_TABLET | Freq: Once | ORAL | Status: AC
  Administered 2022-01-13: 1000 mg via ORAL
  Filled 2022-01-13: qty 2

## 2022-01-13 MED ORDER — DIPHENHYDRAMINE HCL 50 MG/ML IJ SOLN
12.5000 mg | Freq: Once | INTRAMUSCULAR | Status: AC
  Administered 2022-01-13: 12.5 mg via INTRAVENOUS
  Filled 2022-01-13: qty 1

## 2022-01-13 MED ORDER — MAGNESIUM SULFATE 2 GM/50ML IV SOLN
2.0000 g | Freq: Once | INTRAVENOUS | Status: AC
  Administered 2022-01-13: 2 g via INTRAVENOUS
  Filled 2022-01-13: qty 50

## 2022-01-13 MED ORDER — HYDROXYZINE HCL 25 MG PO TABS
25.0000 mg | ORAL_TABLET | Freq: Once | ORAL | Status: AC
  Administered 2022-01-13: 25 mg via ORAL
  Filled 2022-01-13: qty 1

## 2022-01-13 MED ORDER — HYDROCHLOROTHIAZIDE 25 MG PO TABS
25.0000 mg | ORAL_TABLET | Freq: Every day | ORAL | 3 refills | Status: DC
  Filled 2022-01-13: qty 30, 30d supply, fill #0

## 2022-01-13 MED ORDER — HYDROXYZINE PAMOATE 25 MG PO CAPS
25.0000 mg | ORAL_CAPSULE | Freq: Four times a day (QID) | ORAL | 2 refills | Status: DC | PRN
  Filled 2022-01-13: qty 30, 8d supply, fill #0

## 2022-01-13 MED ORDER — PROPRANOLOL HCL 40 MG PO TABS
40.0000 mg | ORAL_TABLET | Freq: Two times a day (BID) | ORAL | 3 refills | Status: DC
  Filled 2022-01-13: qty 60, 30d supply, fill #0

## 2022-01-13 MED ORDER — HYDROCHLOROTHIAZIDE 25 MG PO TABS
25.0000 mg | ORAL_TABLET | Freq: Once | ORAL | Status: AC
  Administered 2022-01-13: 25 mg via ORAL
  Filled 2022-01-13: qty 1

## 2022-01-13 NOTE — ED Provider Notes (Addendum)
[facility] Provider Note    Event Date/Time   First MD Initiated Contact with Patient 01/13/22 1638     (approximate)   History   Hypertension and Chest Pain   HPI  Brandi Swanson is a 46 y.o. female   Past medical history of hypertension, migraine headaches, tobacco use, presents to the emergency department with migraine headache, chest discomfort, hypertension and unable to obtain her antihypertensives due to cost for the last 2 weeks.  States that when her blood pressure is not under control she gets migraine headaches and chest fluttering sensation.  Chest pain is mild and ongoing for 3 weeks.  Headache is bilateral and ongoing for 3 weeks.  Consistent with prior headaches.  She denies drug or alcohol use.  She lost her job and has no insurance.  Denies shortness of breath, cough, fever.  History was obtained via patient      Physical Exam   Triage Vital Signs: ED Triage Vitals [01/13/22 1611]  Enc Vitals Group     BP (!) 161/106     Pulse Rate 81     Resp 18     Temp 98.2 F (36.8 C)     Temp Source Oral     SpO2 96 %     Weight      Height      Head Circumference      Peak Flow      Pain Score 8     Pain Loc      Pain Edu?      Excl. in [place]?     Most recent vital signs: Vitals:   01/13/22 1611  BP: (!) 161/106  Pulse: 81  Resp: 18  Temp: 98.2 F (36.8 C)  SpO2: 96%    General: Awake, no distress.  CV:  Good peripheral perfusion.  Resp:  Normal effort.  Abd:  No distention.  Other:  Hypertension 160/100 otherwise hemodynamics appropriate and reassuring, lungs clear to auscultation abdomen soft and nontender skin appears euvolemic she appears overall comfortable and nontoxic.   ED Results / Procedures / Treatments   Labs (all labs ordered are listed, but only abnormal results are displayed) Labs Reviewed  BASIC METABOLIC PANEL - Abnormal; Notable for the following components:      Result Value   Potassium  3.4 (*)    Chloride 112 (*)    Glucose, Bld 102 (*)    All other components within normal limits  CBC  LIPASE, BLOOD  HEPATIC FUNCTION PANEL  POC URINE PREG, ED  TROPONIN I (HIGH SENSITIVITY)     I reviewed labs and they are notable for normal white blood cell count and H&H.  Troponin is 12.  EKG  ED ECG REPORT I, Pilar Jarvis, the attending physician, personally viewed and interpreted this ECG.   Date: 01/13/2022  EKG Time: 1614  Rate: 78  Rhythm: occasional PVC noted, unifocal  Axis: nl  Intervals: qtc 490  ST&T Change: no acute ischemic changes  RADIOLOGY I independently reviewed and interpreted chest x-ray and see no obvious focalities or pneumothorax.   PROCEDURES:  Critical Care performed: No  Procedures   MEDICATIONS ORDERED IN ED: Medications  metoCLOPramide (REGLAN) injection 10 mg (has no administration in time range)  diphenhydrAMINE (BENADRYL) injection 12.5 mg (has no administration in time range)  acetaminophen (TYLENOL) tablet 1,000 mg (has no administration in time range)  magnesium sulfate IVPB 2 g 50 mL (has no administration in time range)  hydrochlorothiazide (HYDRODIURIL) tablet 25 mg (has no administration in time range)  hydrOXYzine (ATARAX) tablet 25 mg (has no administration in time range)    IMPRESSION / MDM / ASSESSMENT AND PLAN / ED COURSE  I reviewed the triage vital signs and the nursing notes.                              Differential diagnosis includes, but is not limited to, migraine headache, uncontrolled hypertension, ACS, ICH, CVA, electrolyte abnormalities    MDM: Patient with uncontrolled hypertension in the setting of not having access to her medications.  She has had chest fluttering and headache for 3 weeks.  Neurologic exam shows no focal deficits, I doubt CVA or head bleed.  Treat migraine headache medications including Reglan, Benadryl, magnesium.  Give antihypertensives.  Check EKG which fortunately is nonischemic  likely pvc for fluttering sensation, no pain or pressure; and her initial troponin is 12, defer further troponins given chronicity of symptoms and unlikely ACS.  We will give a prescription for home meds to the free pharmacy, have [facility].   Patient's presentation is most consistent with acute presentation with potential threat to life or bodily function.       FINAL CLINICAL IMPRESSION(S) / ED DIAGNOSES   Final diagnoses:  None     Rx / DC Orders   ED Discharge Orders          Ordered    hydrochlorothiazide (HYDRODIURIL) 25 MG tablet  Daily        01/13/22 1658    hydrOXYzine (VISTARIL) 25 MG capsule  Every 6 hours PRN        01/13/22 1658    propranolol (INDERAL) 40 MG tablet  2 times daily        01/13/22 1658             Note:  This document was prepared using Dragon voice recognition software and may include unintentional dictation errors.    Pilar Jarvis, MD 01/13/22 1708    Pilar Jarvis, MD 01/13/22 1736

## 2022-01-13 NOTE — Discharge Instructions (Signed)
Take your prescriptions as prescribed.  [facility] for follow-up appointment.  Thank you for choosing [place] for your health care today!  Please see your primary doctor this week for a follow up appointment.   If you do not have a primary doctor call the following clinics to establish care:  If you have insurance:  [facility] [phone] [address] [place] [place] [address]   [facility]  [phone] [address] [place] [address]   If you do not have insurance:  [facility]  [phone] [address] [place] [address]  Sometimes, in the early stages of certain disease courses it is difficult to detect in the emergency department evaluation -- so, it is important that you continue to monitor your symptoms and call your doctor right away or return to the emergency department if you develop any new or worsening symptoms.  It was my pleasure to care for you today.   Daneil Dan Modesto Charon, MD

## 2022-01-13 NOTE — ED Triage Notes (Signed)
Patient to ED for possible hypertension and headache. Upon triage patient now stating she "has a flutter in my chest that is painful." Takes multiple BP medications- has not taken BP meds in approx 2 weeks due to not having money for medications.

## 2022-01-13 NOTE — ED Provider Triage Note (Signed)
Emergency Medicine Provider Triage Evaluation Note  Brandi Swanson , a 46 y.o. female  was evaluated in triage.  Pt complains of hypertension. Out of her medications for the past 2 weeks.  She is also having a "fluttering" in her chest that is painful.Marland Kitchen  Physical Exam  BP (!) 161/106 (BP Location: Left Arm)   Pulse 81   Temp 98.2 F (36.8 C) (Oral)   Resp 18   SpO2 96%  Gen:   Awake, no distress   Resp:  Normal effort  MSK:   Moves extremities without difficulty  Other:    Medical Decision Making  Medically screening exam initiated at 4:18 PM.  Appropriate orders placed.  Brandi Swanson was informed that the remainder of the evaluation will be completed by another provider, this initial triage assessment does not replace that evaluation, and the importance of remaining in the ED until their evaluation is complete.    Chinita Pester, FNP 01/13/22 1808

## 2022-01-16 ENCOUNTER — Other Ambulatory Visit: Payer: Self-pay

## 2022-01-16 DIAGNOSIS — N6320 Unspecified lump in the left breast, unspecified quadrant: Secondary | ICD-10-CM

## 2022-01-20 ENCOUNTER — Other Ambulatory Visit: Payer: Medicaid Other

## 2022-01-20 ENCOUNTER — Ambulatory Visit: Payer: Medicaid Other | Attending: Hematology and Oncology

## 2022-01-21 ENCOUNTER — Other Ambulatory Visit: Payer: Self-pay

## 2022-02-10 ENCOUNTER — Emergency Department
Admission: EM | Admit: 2022-02-10 | Discharge: 2022-02-10 | Disposition: A | Discharge status: Home / Self Care | Payer: Self-pay | Attending: Emergency Medicine | Admitting: Emergency Medicine

## 2022-02-10 ENCOUNTER — Other Ambulatory Visit: Payer: Self-pay

## 2022-02-10 ENCOUNTER — Emergency Department: Payer: Self-pay

## 2022-02-10 ENCOUNTER — Emergency Department: Payer: Medicaid Other

## 2022-02-10 DIAGNOSIS — I10 Essential (primary) hypertension: Secondary | ICD-10-CM | POA: Insufficient documentation

## 2022-02-10 DIAGNOSIS — J449 Chronic obstructive pulmonary disease, unspecified: Secondary | ICD-10-CM | POA: Insufficient documentation

## 2022-02-10 DIAGNOSIS — R112 Nausea with vomiting, unspecified: Secondary | ICD-10-CM

## 2022-02-10 DIAGNOSIS — F17200 Nicotine dependence, unspecified, uncomplicated: Secondary | ICD-10-CM | POA: Insufficient documentation

## 2022-02-10 DIAGNOSIS — Z1152 Encounter for screening for COVID-19: Secondary | ICD-10-CM | POA: Insufficient documentation

## 2022-02-10 DIAGNOSIS — J101 Influenza due to other identified influenza virus with other respiratory manifestations: Secondary | ICD-10-CM | POA: Insufficient documentation

## 2022-02-10 LAB — COMPREHENSIVE METABOLIC PANEL
ALT: 24 U/L (ref 0–44)
AST: 30 U/L (ref 15–41)
Albumin: 4.1 g/dL (ref 3.5–5.0)
Alkaline Phosphatase: 63 U/L (ref 38–126)
Anion gap: 10 (ref 5–15)
BUN: 11 mg/dL (ref 6–20)
CO2: 20 mmol/L — ABNORMAL LOW (ref 22–32)
Calcium: 8.6 mg/dL — ABNORMAL LOW (ref 8.9–10.3)
Chloride: 104 mmol/L (ref 98–111)
Creatinine, Ser: 0.85 mg/dL (ref 0.44–1.00)
GFR, Estimated: >60 mL/min (ref >60)
Glucose, Bld: 113 mg/dL — ABNORMAL HIGH (ref 70–99)
Potassium: 4.1 mmol/L (ref 3.5–5.1)
Sodium: 134 mmol/L — ABNORMAL LOW (ref 135–145)
Total Bilirubin: 0.3 mg/dL (ref 0.3–1.2)
Total Protein: 8.2 g/dL — ABNORMAL HIGH (ref 6.5–8.1)

## 2022-02-10 LAB — URINALYSIS, ROUTINE W REFLEX MICROSCOPIC
Bilirubin Urine: NEGATIVE
Glucose, UA: NEGATIVE mg/dL
Hgb urine dipstick: NEGATIVE
Ketones, ur: NEGATIVE mg/dL
Leukocytes,Ua: NEGATIVE
Nitrite: NEGATIVE
Protein, ur: 100 mg/dL — AB
Specific Gravity, Urine: 1.027 (ref 1.005–1.030)
pH: 5 (ref 5.0–8.0)

## 2022-02-10 LAB — CBC
HCT: 47.2 % — ABNORMAL HIGH (ref 36.0–46.0)
Hemoglobin: 15.2 g/dL — ABNORMAL HIGH (ref 12.0–15.0)
MCH: 27 pg (ref 26.0–34.0)
MCHC: 32.2 g/dL (ref 30.0–36.0)
MCV: 83.7 fL (ref 80.0–100.0)
Platelets: 194 10*3/uL (ref 150–400)
RBC: 5.64 MIL/uL — ABNORMAL HIGH (ref 3.87–5.11)
RDW: 14.4 % (ref 11.5–15.5)
WBC: 3.5 10*3/uL — ABNORMAL LOW (ref 4.0–10.5)
nRBC: 0 % (ref 0.0–0.2)

## 2022-02-10 LAB — LIPASE, BLOOD: Lipase: 34 U/L (ref 11–51)

## 2022-02-10 LAB — RESP PANEL BY RT-PCR (RSV, FLU A&B, COVID)  RVPGX2
Influenza A by PCR: POSITIVE — AB
Influenza B by PCR: NEGATIVE
Resp Syncytial Virus by PCR: NEGATIVE
SARS Coronavirus 2 by RT PCR: NEGATIVE

## 2022-02-10 LAB — PREGNANCY, URINE: Preg Test, Ur: NEGATIVE

## 2022-02-10 MED ORDER — IOHEXOL 300 MG/ML  SOLN
100.0000 mL | Freq: Once | INTRAMUSCULAR | Status: AC | PRN
  Administered 2022-02-10: 100 mL via INTRAVENOUS

## 2022-02-10 MED ORDER — ACETAMINOPHEN 500 MG PO TABS
1000.0000 mg | ORAL_TABLET | Freq: Once | ORAL | Status: AC
  Administered 2022-02-10: 1000 mg via ORAL
  Filled 2022-02-10: qty 2

## 2022-02-10 MED ORDER — ONDANSETRON 4 MG PO TBDP
4.0000 mg | ORAL_TABLET | Freq: Once | ORAL | Status: AC | PRN
  Administered 2022-02-10: 4 mg via ORAL
  Filled 2022-02-10: qty 1

## 2022-02-10 MED ORDER — LACTATED RINGERS IV BOLUS
1000.0000 mL | Freq: Once | INTRAVENOUS | Status: AC
  Administered 2022-02-10: 1000 mL via INTRAVENOUS

## 2022-02-10 MED ORDER — ONDANSETRON 4 MG PO TBDP
4.0000 mg | ORAL_TABLET | Freq: Three times a day (TID) | ORAL | 0 refills | Status: AC | PRN
  Filled 2022-02-10: qty 18, 6d supply, fill #0

## 2022-02-10 NOTE — ED Provider Notes (Signed)
[facility] Provider Note    Event Date/Time   First MD Initiated Contact with Patient 02/10/22 1129     (approximate)   History   Abdominal Pain (Pt. To ED via POV for n/v/d Friday, sat and Sunday, with abdominal pain Monday/Tuesday. Pt. Denies CP, SOB, dizziness. Pt. States abdominal pain increasing. )   HPI  AMERIA POSTLETHWAIT is a 46 y.o. female past medical history hypertension who presents with nausea vomiting diarrhea abdominal pain.  Symptoms started on Friday.  Has had multiple episodes of yellow vomit but only 1 episode today.  Also multiple episodes of loose stool.  No blood in her stool.  She is having diffuse abdominal pain worse with vomiting and with coughing.  Has had low-grade fevers as well.  Has had some congestion and mild cough.  No dyspnea or chest pain.  No sick contacts.     Past Medical History:  Diagnosis Date   Acute cholecystitis due to biliary calculus 03/15/2018   Hypertension    Migraine with aura 08/08/2015   Tarsal tunnel syndrome    Tarsal tunnel syndrome of left side 08/08/2015   Tobacco abuse 08/08/2015    Patient Active Problem List   Diagnosis Date Noted   Migraine headache with aura 09/05/2015   Foot pain, left 08/08/2015   Tarsal tunnel syndrome of left side 08/08/2015   Foot callus 08/08/2015   Tobacco abuse 08/08/2015   Migraine with aura 08/08/2015   COPD (chronic obstructive pulmonary disease) ([place]) 07/04/2015   Seasonal allergies 07/04/2015   Acid reflux 07/04/2015   Hypertension 07/04/2015   Depression 07/04/2015   Anxiety 07/04/2015     Physical Exam  Triage Vital Signs: ED Triage Vitals  Enc Vitals Group     BP 02/10/22 0937 (!) 168/102     Pulse Rate 02/10/22 0937 86     Resp 02/10/22 0937 18     Temp 02/10/22 0937 (!) 100.8 F (38.2 C)     Temp Source 02/10/22 0937 Oral     SpO2 02/10/22 0937 95 %     Weight 02/10/22 0938 165 lb (74.8 kg)     Height 02/10/22 0938 5\' 5"  (1.651 m)     Head  Circumference --      Peak Flow --      Pain Score 02/10/22 0938 10     Pain Loc --      Pain Edu? --      Excl. in [place]? --     Most recent vital signs: Vitals:   02/10/22 0937  BP: (!) 168/102  Pulse: 86  Resp: 18  Temp: (!) 100.8 F (38.2 C)  SpO2: 95%     General: Awake, no distress.  tearful CV:  Good peripheral perfusion.  Resp:  Normal effort.  Abd:  No distention.  Abdomen is soft with mild tenderness throughout but no guarding Neuro:             Awake, Alert, Oriented x 3  Other:     ED Results / Procedures / Treatments  Labs (all labs ordered are listed, but only abnormal results are displayed) Labs Reviewed  RESP PANEL BY RT-PCR (RSV, FLU A&B, COVID)  RVPGX2 - Abnormal; Notable for the following components:      Result Value   Influenza A by PCR POSITIVE (*)    All other components within normal limits  COMPREHENSIVE METABOLIC PANEL - Abnormal; Notable for the following components:   Sodium 134 (*)  CO2 20 (*)    Glucose, Bld 113 (*)    Calcium 8.6 (*)    Total Protein 8.2 (*)    All other components within normal limits  CBC - Abnormal; Notable for the following components:   WBC 3.5 (*)    RBC 5.64 (*)    Hemoglobin 15.2 (*)    HCT 47.2 (*)    All other components within normal limits  URINALYSIS, ROUTINE W REFLEX MICROSCOPIC - Abnormal; Notable for the following components:   Color, Urine YELLOW (*)    APPearance CLOUDY (*)    Protein, ur 100 (*)    Bacteria, UA FEW (*)    All other components within normal limits  LIPASE, BLOOD  PREGNANCY, URINE  POC URINE PREG, ED  TROPONIN I (HIGH SENSITIVITY)  TROPONIN I (HIGH SENSITIVITY)     EKG     RADIOLOGY    PROCEDURES:  Critical Care performed: No  Procedures    MEDICATIONS ORDERED IN ED: Medications  ondansetron (ZOFRAN-ODT) disintegrating tablet 4 mg (4 mg Oral Given 02/10/22 0945)  lactated ringers bolus 1,000 mL (1,000 mLs Intravenous New Bag/Given 02/10/22 1228)   acetaminophen (TYLENOL) tablet 1,000 mg (1,000 mg Oral Given 02/10/22 1228)  iohexol (OMNIPAQUE) 300 MG/ML solution 100 mL (100 mLs Intravenous Contrast Given 02/10/22 1251)     IMPRESSION / MDM / ASSESSMENT AND PLAN / ED COURSE  I reviewed the triage vital signs and the nursing notes.                              Patient's presentation is most consistent with acute complicated illness / injury requiring diagnostic workup.  Differential diagnosis includes, but is not limited to, viral gastroenteritis, viral illness such as influenza, less likely pancreatitis, gastritis, muscle pain  Patient is a 46 year old female presents with nausea vomiting diarrhea and abdominal pain since Friday.  Her vomiting is overall slowing down and is only mild 1 episode today as is the diarrhea.  She has not had blood in her stool.  Had some cough congestion but no dyspnea.  Temp is 100.8 here.  She is complaining of diffuse abdominal pain and on exam she does have diffuse tenderness but abdomen is soft there is no guarding.  Labs reveal positive influenza test which I think likely explains her symptoms.  CBC otherwise notable for mild leukopenia white count 3.5.  UA does not suggest infection.  CMP with just mild hyponatremia.  Will give a bolus of fluid and Tylenol.  Will obtain CT abdomen pelvis given her abdominal pain but overall I suspect that this is in the setting of frequent vomiting likely musculoskeletal.  CT abdomen pelvis does not show any acute intra-abdominal findings.  There is some comment of possible lower lobe pneumonia.  Chest x-ray obtained does not show any lobar infiltrate suspect that the findings are due to the influenza.  Given patient's prominent nausea and vomiting and the fact that symptoms have been going on for greater than 48 hours do not think she is a good candidate for Tamiflu.  Discussed supportive measures.  Will give prescription for Zofran.     FINAL CLINICAL IMPRESSION(S) / ED  DIAGNOSES   Final diagnoses:  Nausea vomiting and diarrhea  Influenza A     Rx / DC Orders   ED Discharge Orders     None        Note:  This document was prepared using  Dragon [employer] engineer and may include unintentional dictation errors.   Georga Hacking, MD 02/10/22 1351

## 2022-02-10 NOTE — ED Triage Notes (Signed)
Pt. To ED via POV for n/v/d Friday, sat and Sunday, with abdominal pain Monday/Tuesday. Pt. Denies CP, SOB, dizziness. Pt. States abdominal pain increasing. Pt. Took 1 gram tylenol 3 hours pta.

## 2022-02-10 NOTE — ED Provider Triage Note (Signed)
  Emergency Medicine Provider Triage Evaluation Note  Brandi Swanson , a 46 y.o.female,  was evaluated in triage.  Pt complains of abdominal pain.  Patient states that she began experiencing nausea, vomiting, and diarrhea approximately 4 days ago.  About 2 days ago, progressed into abdominal pain.  Denies any other symptoms.   Review of Systems  Positive: Abdominal pain, nausea, vomiting, diarrhea Negative: Denies fever, chest pain, shortness of breath.  Physical Exam   Vitals:   02/10/22 0937  BP: (!) 168/102  Pulse: 86  Resp: 18  Temp: (!) 100.8 F (38.2 C)  SpO2: 95%   Gen:   Awake, no distress   Resp:  Normal effort  MSK:   Moves extremities without difficulty  Other:  Reported tenderness diffusely across the abdomen.  Medical Decision Making  Given the patient's initial medical screening exam, the following diagnostic evaluation has been ordered. The patient will be placed in the appropriate treatment space, once one is available, to complete the evaluation and treatment. I have discussed the plan of care with the patient and I have advised the patient that an ED physician or mid-level practitioner will reevaluate their condition after the test results have been received, as the results may give them additional insight into the type of treatment they may need.    Diagnostics: Labs, UA, abdominal CT, respiratory panel.  Treatments: none immediately   Varney Daily, [place] 02/10/22 1011

## 2022-02-24 ENCOUNTER — Other Ambulatory Visit: Payer: Self-pay

## 2022-09-19 ENCOUNTER — Emergency Department: Payer: Medicaid Other

## 2022-09-19 ENCOUNTER — Other Ambulatory Visit: Payer: Self-pay

## 2022-09-19 ENCOUNTER — Emergency Department
Admission: EM | Admit: 2022-09-19 | Discharge: 2022-09-19 | Disposition: A | Discharge status: Home / Self Care | Payer: Medicaid Other | Attending: Emergency Medicine | Admitting: Emergency Medicine

## 2022-09-19 DIAGNOSIS — I1A0 Resistant hypertension: Secondary | ICD-10-CM

## 2022-09-19 DIAGNOSIS — G43909 Migraine, unspecified, not intractable, without status migrainosus: Secondary | ICD-10-CM | POA: Diagnosis not present

## 2022-09-19 DIAGNOSIS — I10 Essential (primary) hypertension: Secondary | ICD-10-CM | POA: Diagnosis not present

## 2022-09-19 DIAGNOSIS — R519 Headache, unspecified: Secondary | ICD-10-CM | POA: Diagnosis present

## 2022-09-19 DIAGNOSIS — G43009 Migraine without aura, not intractable, without status migrainosus: Secondary | ICD-10-CM

## 2022-09-19 LAB — COMPREHENSIVE METABOLIC PANEL
ALT: 15 U/L (ref 0–44)
AST: 17 U/L (ref 15–41)
Albumin: 4.2 g/dL (ref 3.5–5.0)
Alkaline Phosphatase: 65 U/L (ref 38–126)
Anion gap: 10 (ref 5–15)
BUN: 13 mg/dL (ref 6–20)
CO2: 20 mmol/L — ABNORMAL LOW (ref 22–32)
Calcium: 9.1 mg/dL (ref 8.9–10.3)
Chloride: 109 mmol/L (ref 98–111)
Creatinine, Ser: 0.85 mg/dL (ref 0.44–1.00)
GFR, Estimated: >60 mL/min (ref >60)
Glucose, Bld: 119 mg/dL — ABNORMAL HIGH (ref 70–99)
Potassium: 3.4 mmol/L — ABNORMAL LOW (ref 3.5–5.1)
Sodium: 139 mmol/L (ref 135–145)
Total Bilirubin: 0.5 mg/dL (ref 0.3–1.2)
Total Protein: 7.9 g/dL (ref 6.5–8.1)

## 2022-09-19 LAB — CBC WITH DIFFERENTIAL/PLATELET
Abs Immature Granulocytes: 0.01 10*3/uL (ref 0.00–0.07)
Basophils Absolute: 0.1 10*3/uL (ref 0.0–0.1)
Basophils Relative: 1 %
Eosinophils Absolute: 0.2 10*3/uL (ref 0.0–0.5)
Eosinophils Relative: 5 %
HCT: 47.4 % — ABNORMAL HIGH (ref 36.0–46.0)
Hemoglobin: 15.4 g/dL — ABNORMAL HIGH (ref 12.0–15.0)
Immature Granulocytes: 0 %
Lymphocytes Relative: 55 %
Lymphs Abs: 2.8 10*3/uL (ref 0.7–4.0)
MCH: 27.5 pg (ref 26.0–34.0)
MCHC: 32.5 g/dL (ref 30.0–36.0)
MCV: 84.6 fL (ref 80.0–100.0)
Monocytes Absolute: 0.6 10*3/uL (ref 0.1–1.0)
Monocytes Relative: 11 %
Neutro Abs: 1.4 10*3/uL — ABNORMAL LOW (ref 1.7–7.7)
Neutrophils Relative %: 28 %
Platelets: 279 10*3/uL (ref 150–400)
RBC: 5.6 MIL/uL — ABNORMAL HIGH (ref 3.87–5.11)
RDW: 15.1 % (ref 11.5–15.5)
WBC: 5.1 10*3/uL (ref 4.0–10.5)
nRBC: 0 % (ref 0.0–0.2)

## 2022-09-19 MED ORDER — LABETALOL HCL 5 MG/ML IV SOLN
20.0000 mg | Freq: Once | INTRAVENOUS | Status: AC
  Administered 2022-09-19: 20 mg via INTRAVENOUS
  Filled 2022-09-19: qty 4

## 2022-09-19 MED ORDER — KETOROLAC TROMETHAMINE 15 MG/ML IJ SOLN
15.0000 mg | Freq: Once | INTRAMUSCULAR | Status: AC
  Administered 2022-09-19: 15 mg via INTRAVENOUS
  Filled 2022-09-19: qty 1

## 2022-09-19 MED ORDER — DEXAMETHASONE SODIUM PHOSPHATE 10 MG/ML IJ SOLN
10.0000 mg | Freq: Once | INTRAMUSCULAR | Status: AC
  Administered 2022-09-19: 10 mg via INTRAVENOUS
  Filled 2022-09-19: qty 1

## 2022-09-19 MED ORDER — AMLODIPINE BESYLATE 10 MG PO TABS: 10.0000 mg | ORAL_TABLET | Freq: Every day | ORAL | 0 refills | Status: AC

## 2022-09-19 MED ORDER — DIPHENHYDRAMINE HCL 50 MG/ML IJ SOLN
50.0000 mg | Freq: Once | INTRAMUSCULAR | Status: AC
  Administered 2022-09-19: 50 mg via INTRAVENOUS
  Filled 2022-09-19: qty 1

## 2022-09-19 MED ORDER — METOCLOPRAMIDE HCL 5 MG/ML IJ SOLN
10.0000 mg | Freq: Once | INTRAMUSCULAR | Status: AC
  Administered 2022-09-19: 10 mg via INTRAVENOUS
  Filled 2022-09-19: qty 2

## 2022-09-19 NOTE — ED Provider Notes (Signed)
[facility] Provider Note   Event Date/Time   First MD Initiated Contact with Patient 09/19/22 1622     (approximate) History  Headache  HPI Brandi Swanson is a 47 y.o. female with a stated past medical history of hypertension and migraines who presents complaining of headache over the last 24 hours.  Patient states that she has taken all of her medications on time and as prescribed today.  Patient states she is continuing to have high blood pressure with associated headache.  Patient states this is similar symptoms to previous migraines she has had in the past.  Patient denies taking any medication daily for migraines.  Patient denies any neurologist or headache specialist that she has seen in the past. ROS: Patient currently denies any vision changes, tinnitus, difficulty speaking, facial droop, sore throat, chest pain, shortness of breath, abdominal pain, nausea/vomiting/diarrhea, dysuria, or weakness/numbness/paresthesias in any extremity   Physical Exam  Triage Vital Signs: ED Triage Vitals  Encounter Vitals Group     BP 09/19/22 1550 (!) 226/146     Systolic BP Percentile --      Diastolic BP Percentile --      Pulse Rate 09/19/22 1548 73     Resp 09/19/22 1548 18     Temp 09/19/22 1548 98 F (36.7 C)     Temp Source 09/19/22 1548 Oral     SpO2 --      Weight --      Height --      Head Circumference --      Peak Flow --      Pain Score 09/19/22 1549 10     Pain Loc --      Pain Education --      Exclude from Growth Chart --    Most recent vital signs: Vitals:   09/19/22 1800 09/19/22 1830  BP: (!) 195/95 (!) 185/98  Pulse:    Resp:    Temp:     General: Awake, oriented x4. CV:  Good peripheral perfusion.  Resp:  Normal effort.  Abd:  No distention.  Other:  Middle-aged overweight African-American female laying in bed in mild distress secondary to pain.  NIHSS is 0 ED Results / Procedures / Treatments  Labs (all labs ordered are  listed, but only abnormal results are displayed) Labs Reviewed  CBC WITH DIFFERENTIAL/PLATELET - Abnormal; Notable for the following components:      Result Value   RBC 5.60 (*)    Hemoglobin 15.4 (*)    HCT 47.4 (*)    Neutro Abs 1.4 (*)    All other components within normal limits  COMPREHENSIVE METABOLIC PANEL - Abnormal; Notable for the following components:   Potassium 3.4 (*)    CO2 20 (*)    Glucose, Bld 119 (*)    All other components within normal limits   RADIOLOGY ED MD interpretation:   -Agree with radiology assessment Official radiology report(s): CT HEAD WO CONTRAST ( )  Result Date: 09/19/2022 CLINICAL DATA:  Headache. EXAM: CT HEAD WITHOUT CONTRAST TECHNIQUE: Contiguous axial images were obtained from the base of the skull through the vertex without intravenous contrast. RADIATION DOSE REDUCTION: This exam was performed according to the departmental dose-optimization program which includes automated exposure control, adjustment of the mA and/or kV according to patient size and/or use of iterative reconstruction technique. COMPARISON:  CT 05/11/2008 FINDINGS: Brain: No evidence of acute infarction, hemorrhage, hydrocephalus, extra-axial collection or mass lesion/mass effect. Vascular: No hyperdense vessel  or unexpected calcification. Skull: No acute abnormality. Stable appearance of focal expansile remodeling of the left anterior temporal bone, image 25/2. This is remained unchanged since 2008 and is compatible with a benign process. Sinuses/Orbits: No acute abnormality.  Mastoid air cells are clear. Other: None. IMPRESSION: No acute intracranial abnormality. Electronically Signed   By: Signa Kell M.D.   On: 09/19/2022 16:19   PROCEDURES: Critical Care performed: No Procedures MEDICATIONS ORDERED IN ED: Medications  metoCLOPramide (REGLAN) injection 10 mg (10 mg Intravenous Given 09/19/22 1737)  labetalol (NORMODYNE) injection 20 mg (20 mg Intravenous Given 09/19/22 1737)   dexamethasone (DECADRON) injection 10 mg (10 mg Intravenous Given 09/19/22 1738)  diphenhydrAMINE (BENADRYL) injection 50 mg (50 mg Intravenous Given 09/19/22 1738)  ketorolac (TORADOL) 15 MG/ML injection 15 mg (15 mg Intravenous Given 09/19/22 1737)   IMPRESSION / MDM / ASSESSMENT AND PLAN / ED COURSE  I reviewed the triage vital signs and the nursing notes.                             The patient is on the cardiac monitor to evaluate for evidence of arrhythmia and/or significant heart rate changes. Patient's presentation is most consistent with acute presentation with potential threat to life or bodily function. This patient presents with a headache most consistent with benign headache from either tension type headache vs migraine. No headache red flags. Neurologic exam without evidence of meningismus, AMS, focal neurologic findings so doubt meningitis, encephalitis, stroke. Presentation not consistent with acute intracranial bleed to include SAH (lack of risk factors, headache history). No history of trauma so doubt ICH. Given history and physical temporal arteritis unlikely, as is acute angle closure glaucoma. Doubt carotid artery dissection given no focal neuro deficits, no neck trauma or recent neck strain. Patient with no signs of increased intracranial pressure or weight loss and history and physical suggest more benign headache so less likely mass effect in brain from tumor or abscess or idiopathic intracranial hypertension. Pain was controlled with headache cocktail and patient discharged home with PMD follow up.   FINAL CLINICAL IMPRESSION(S) / ED DIAGNOSES   Final diagnoses:  Migraine without aura and without status migrainosus, not intractable  Resistant hypertension   Rx / DC Orders   ED Discharge Orders          Ordered    amLODipine (NORVASC) 10 MG tablet  Daily        09/19/22 1849           Note:  This document was prepared using Dragon voice recognition software and may  include unintentional dictation errors.   Merwyn Katos, MD 09/19/22 [phone]

## 2022-09-19 NOTE — ED Triage Notes (Signed)
Pt presents to ED with /co of a headache that started this morning. Pt states HX of migraines and took her amitriptyline and states no relief. Pt is tearful on triage.

## 2022-11-02 ENCOUNTER — Other Ambulatory Visit: Payer: Self-pay

## 2022-11-11 ENCOUNTER — Other Ambulatory Visit: Payer: Self-pay | Admitting: Family Medicine

## 2022-11-11 DIAGNOSIS — Z1231 Encounter for screening mammogram for malignant neoplasm of breast: Secondary | ICD-10-CM

## 2023-01-21 ENCOUNTER — Encounter: Payer: Self-pay | Admitting: Emergency Medicine

## 2023-01-21 ENCOUNTER — Emergency Department: Payer: 59

## 2023-01-21 ENCOUNTER — Inpatient Hospital Stay
Admission: EM | Admit: 2023-01-21 | Discharge: 2023-01-25 | DRG: 305 | Disposition: A | Discharge status: Home / Self Care | Payer: 59 | Attending: Internal Medicine | Admitting: Internal Medicine

## 2023-01-21 ENCOUNTER — Other Ambulatory Visit: Payer: Self-pay

## 2023-01-21 DIAGNOSIS — M940 Chondrocostal junction syndrome [Tietze]: Secondary | ICD-10-CM | POA: Diagnosis present

## 2023-01-21 DIAGNOSIS — R079 Chest pain, unspecified: Secondary | ICD-10-CM | POA: Diagnosis present

## 2023-01-21 DIAGNOSIS — I959 Hypotension, unspecified: Secondary | ICD-10-CM | POA: Diagnosis not present

## 2023-01-21 DIAGNOSIS — I161 Hypertensive emergency: Secondary | ICD-10-CM | POA: Diagnosis not present

## 2023-01-21 DIAGNOSIS — F419 Anxiety disorder, unspecified: Secondary | ICD-10-CM | POA: Diagnosis present

## 2023-01-21 DIAGNOSIS — I517 Cardiomegaly: Secondary | ICD-10-CM

## 2023-01-21 DIAGNOSIS — Z825 Family history of asthma and other chronic lower respiratory diseases: Secondary | ICD-10-CM

## 2023-01-21 DIAGNOSIS — Z8249 Family history of ischemic heart disease and other diseases of the circulatory system: Secondary | ICD-10-CM

## 2023-01-21 DIAGNOSIS — Z79899 Other long term (current) drug therapy: Secondary | ICD-10-CM

## 2023-01-21 DIAGNOSIS — Z888 Allergy status to other drugs, medicaments and biological substances status: Secondary | ICD-10-CM

## 2023-01-21 DIAGNOSIS — Z5986 Financial insecurity: Secondary | ICD-10-CM

## 2023-01-21 DIAGNOSIS — Z886 Allergy status to analgesic agent status: Secondary | ICD-10-CM

## 2023-01-21 DIAGNOSIS — I119 Hypertensive heart disease without heart failure: Secondary | ICD-10-CM | POA: Diagnosis present

## 2023-01-21 DIAGNOSIS — K219 Gastro-esophageal reflux disease without esophagitis: Secondary | ICD-10-CM | POA: Diagnosis present

## 2023-01-21 DIAGNOSIS — R0789 Other chest pain: Secondary | ICD-10-CM | POA: Diagnosis present

## 2023-01-21 DIAGNOSIS — E872 Acidosis, unspecified: Secondary | ICD-10-CM | POA: Diagnosis present

## 2023-01-21 DIAGNOSIS — Y990 Civilian activity done for income or pay: Secondary | ICD-10-CM

## 2023-01-21 DIAGNOSIS — F17210 Nicotine dependence, cigarettes, uncomplicated: Secondary | ICD-10-CM | POA: Diagnosis present

## 2023-01-21 DIAGNOSIS — G44221 Chronic tension-type headache, intractable: Secondary | ICD-10-CM

## 2023-01-21 DIAGNOSIS — G43109 Migraine with aura, not intractable, without status migrainosus: Secondary | ICD-10-CM | POA: Diagnosis present

## 2023-01-21 DIAGNOSIS — Z5982 Transportation insecurity: Secondary | ICD-10-CM

## 2023-01-21 DIAGNOSIS — F32A Depression, unspecified: Secondary | ICD-10-CM | POA: Diagnosis present

## 2023-01-21 DIAGNOSIS — Z716 Tobacco abuse counseling: Secondary | ICD-10-CM

## 2023-01-21 DIAGNOSIS — Z1152 Encounter for screening for COVID-19: Secondary | ICD-10-CM

## 2023-01-21 DIAGNOSIS — J449 Chronic obstructive pulmonary disease, unspecified: Secondary | ICD-10-CM | POA: Diagnosis present

## 2023-01-21 DIAGNOSIS — I160 Hypertensive urgency: Principal | ICD-10-CM

## 2023-01-21 LAB — HCG, QUANTITATIVE, PREGNANCY: hCG, Beta Chain, Quant, S: <1 m[IU]/mL (ref <5)

## 2023-01-21 LAB — URINE DRUG SCREEN, QUALITATIVE (ARMC ONLY)
Amphetamines, Ur Screen: NOT DETECTED
Barbiturates, Ur Screen: NOT DETECTED
Benzodiazepine, Ur Scrn: NOT DETECTED
Cannabinoid 50 Ng, Ur ~~LOC~~: POSITIVE — AB
Cocaine Metabolite,Ur ~~LOC~~: NOT DETECTED
MDMA (Ecstasy)Ur Screen: NOT DETECTED
Methadone Scn, Ur: NOT DETECTED
Opiate, Ur Screen: NOT DETECTED
Phencyclidine (PCP) Ur S: NOT DETECTED
Tricyclic, Ur Screen: NOT DETECTED

## 2023-01-21 LAB — BASIC METABOLIC PANEL
Anion gap: 9 (ref 5–15)
BUN: 17 mg/dL (ref 6–20)
CO2: 20 mmol/L — ABNORMAL LOW (ref 22–32)
Calcium: 8.9 mg/dL (ref 8.9–10.3)
Chloride: 111 mmol/L (ref 98–111)
Creatinine, Ser: 0.86 mg/dL (ref 0.44–1.00)
GFR, Estimated: >60 mL/min (ref >60)
Glucose, Bld: 99 mg/dL (ref 70–99)
Potassium: 3.7 mmol/L (ref 3.5–5.1)
Sodium: 140 mmol/L (ref 135–145)

## 2023-01-21 LAB — CBC
HCT: 46 % (ref 36.0–46.0)
Hemoglobin: 15 g/dL (ref 12.0–15.0)
MCH: 28.3 pg (ref 26.0–34.0)
MCHC: 32.6 g/dL (ref 30.0–36.0)
MCV: 86.8 fL (ref 80.0–100.0)
Platelets: 231 10*3/uL (ref 150–400)
RBC: 5.3 MIL/uL — ABNORMAL HIGH (ref 3.87–5.11)
RDW: 14.8 % (ref 11.5–15.5)
WBC: 6.1 10*3/uL (ref 4.0–10.5)
nRBC: 0 % (ref 0.0–0.2)

## 2023-01-21 LAB — TROPONIN I (HIGH SENSITIVITY)
Troponin I (High Sensitivity): 15 ng/L (ref <18)
Troponin I (High Sensitivity): 15 ng/L (ref <18)

## 2023-01-21 MED ORDER — SODIUM CHLORIDE 0.9% FLUSH: 3.0000 mL | INTRAVENOUS | Status: DC | PRN

## 2023-01-21 MED ORDER — LACTATED RINGERS IV SOLN: Freq: Once | INTRAVENOUS | Status: DC

## 2023-01-21 MED ORDER — HYDROCHLOROTHIAZIDE 25 MG PO TABS
25.0000 mg | ORAL_TABLET | Freq: Every day | ORAL | Status: DC
  Administered 2023-01-21 – 2023-01-22 (×2): 25 mg via ORAL
  Filled 2023-01-21 (×2): qty 1

## 2023-01-21 MED ORDER — HYDRALAZINE HCL 20 MG/ML IJ SOLN
10.0000 mg | Freq: Four times a day (QID) | INTRAMUSCULAR | Status: DC | PRN
  Administered 2023-01-22 (×2): 10 mg via INTRAVENOUS
  Filled 2023-01-21 (×2): qty 1

## 2023-01-21 MED ORDER — NITROGLYCERIN 0.4 MG SL SUBL
0.4000 mg | SUBLINGUAL_TABLET | SUBLINGUAL | Status: DC | PRN
  Administered 2023-01-23: 0.4 mg via SUBLINGUAL
  Filled 2023-01-21: qty 1

## 2023-01-21 MED ORDER — ACETAMINOPHEN 325 MG PO TABS
650.0000 mg | ORAL_TABLET | Freq: Four times a day (QID) | ORAL | Status: DC | PRN
Start: 2023-01-21 — End: 2023-01-25
  Administered 2023-01-22 – 2023-01-23 (×2): 650 mg via ORAL
  Filled 2023-01-21 (×2): qty 2

## 2023-01-21 MED ORDER — LABETALOL HCL 5 MG/ML IV SOLN
10.0000 mg | Freq: Once | INTRAVENOUS | Status: AC
  Administered 2023-01-21: 10 mg via INTRAVENOUS
  Filled 2023-01-21: qty 4

## 2023-01-21 MED ORDER — DIPHENHYDRAMINE HCL 50 MG/ML IJ SOLN
12.5000 mg | Freq: Once | INTRAMUSCULAR | Status: AC
  Administered 2023-01-21: 12.5 mg via INTRAVENOUS
  Filled 2023-01-21: qty 1

## 2023-01-21 MED ORDER — ALBUTEROL SULFATE (2.5 MG/3ML) 0.083% IN NEBU: 2.5000 mg | INHALATION_SOLUTION | RESPIRATORY_TRACT | Status: DC | PRN

## 2023-01-21 MED ORDER — LABETALOL HCL 5 MG/ML IV SOLN
20.0000 mg | Freq: Once | INTRAVENOUS | Status: AC
  Administered 2023-01-21: 20 mg via INTRAVENOUS
  Filled 2023-01-21: qty 4

## 2023-01-21 MED ORDER — DILTIAZEM HCL ER 60 MG PO CP12
60.0000 mg | ORAL_CAPSULE | Freq: Two times a day (BID) | ORAL | Status: DC
  Administered 2023-01-21 – 2023-01-22 (×2): 60 mg via ORAL
  Filled 2023-01-21 (×2): qty 1

## 2023-01-21 MED ORDER — AMLODIPINE BESYLATE 5 MG PO TABS: 10.0000 mg | ORAL_TABLET | Freq: Every day | ORAL | Status: DC

## 2023-01-21 MED ORDER — HEPARIN SODIUM (PORCINE) 5000 UNIT/ML IJ SOLN
5000.0000 [IU] | Freq: Two times a day (BID) | INTRAMUSCULAR | Status: DC
  Administered 2023-01-21 – 2023-01-25 (×8): 5000 [IU] via SUBCUTANEOUS
  Filled 2023-01-21 (×8): qty 1

## 2023-01-21 MED ORDER — METOCLOPRAMIDE HCL 5 MG/ML IJ SOLN
10.0000 mg | Freq: Once | INTRAMUSCULAR | Status: AC
  Administered 2023-01-21: 10 mg via INTRAVENOUS
  Filled 2023-01-21: qty 2

## 2023-01-21 MED ORDER — NICOTINE 21 MG/24HR TD PT24
21.0000 mg | MEDICATED_PATCH | Freq: Every day | TRANSDERMAL | Status: DC
  Administered 2023-01-21 – 2023-01-25 (×6): 21 mg via TRANSDERMAL
  Filled 2023-01-21 (×6): qty 1

## 2023-01-21 MED ORDER — SODIUM CHLORIDE 0.9 % IV SOLN: 250.0000 mL | INTRAVENOUS | Status: DC | PRN

## 2023-01-21 MED ORDER — SODIUM CHLORIDE 0.9% FLUSH
3.0000 mL | Freq: Two times a day (BID) | INTRAVENOUS | Status: DC
Start: 2023-01-21 — End: 2023-01-21

## 2023-01-21 MED ORDER — HYDROCODONE-ACETAMINOPHEN 5-325 MG PO TABS
1.0000 | ORAL_TABLET | ORAL | Status: DC | PRN
  Administered 2023-01-22 – 2023-01-25 (×6): 1 via ORAL
  Filled 2023-01-21 (×8): qty 1

## 2023-01-21 MED ORDER — AMITRIPTYLINE HCL 50 MG PO TABS
50.0000 mg | ORAL_TABLET | Freq: Every day | ORAL | Status: DC
  Administered 2023-01-21 – 2023-01-24 (×4): 50 mg via ORAL
  Filled 2023-01-21 (×4): qty 1

## 2023-01-21 MED ORDER — PANTOPRAZOLE SODIUM 40 MG PO TBEC
40.0000 mg | DELAYED_RELEASE_TABLET | Freq: Every day | ORAL | Status: DC
  Administered 2023-01-21 – 2023-01-25 (×5): 40 mg via ORAL
  Filled 2023-01-21 (×5): qty 1

## 2023-01-21 MED ORDER — SODIUM CHLORIDE 0.9% FLUSH
3.0000 mL | Freq: Two times a day (BID) | INTRAVENOUS | Status: DC
  Administered 2023-01-21 – 2023-01-25 (×6): 3 mL via INTRAVENOUS

## 2023-01-21 MED ORDER — ACETAMINOPHEN 650 MG RE SUPP: 650.0000 mg | Freq: Four times a day (QID) | RECTAL | Status: DC | PRN

## 2023-01-21 MED ORDER — MORPHINE SULFATE (PF) 2 MG/ML IV SOLN
2.0000 mg | INTRAVENOUS | Status: DC | PRN
  Administered 2023-01-22: 2 mg via INTRAVENOUS
  Filled 2023-01-21: qty 1

## 2023-01-21 MED ORDER — IOHEXOL 350 MG/ML SOLN
100.0000 mL | Freq: Once | INTRAVENOUS | Status: AC | PRN
  Administered 2023-01-21: 100 mL via INTRAVENOUS

## 2023-01-21 NOTE — H&P (Addendum)
History and Physical    Patient: Brandi Swanson EPP:[id number] DOB: [birth year] DOA: 01/21/2023 DOS: the patient was seen and examined on 01/22/2023 PCP: Hillery Aldo, MD  Patient coming from: Home  Chief Complaint:  Chief Complaint  Patient presents with   Chest Pain   Hypertension    HPI: Brandi Swanson is a 47 y.o. female with medical history significant for essential hypertension, tobacco abuse, allergies to ibuprofen, COPD, acid reflux/GERD, depression/anxiety, migraine headaches with aura presenting today with complaints of chest pain and elevated blood pressures.  Chest pain started while patient was getting off of work.  Patient ensures compliance with her medication regimen for high blood pressure.  In the beginning of the chest pain started and her blood pressure was high he started to have also of severe headache along with the chest pain.  Admission requested for hypertensive emergency with chest pain and headaches.  In emergency room vitals trend shows: Elevated blood pressure initially afebrile normal O2 sats. Vitals:   01/21/23 1648 01/21/23 1930 01/21/23 2100 01/21/23 2238  BP: (!) 226/115 (!) 194/109 (!) 174/93   Pulse: 87 76 71   Temp: 98.2 F (36.8 C)   98.3 F (36.8 C)  Resp: 16 (!) 23 (!) 21   Height: 5\' 5"  (1.651 m)     Weight: 89.4 kg     SpO2: 100% 100% 99%   TempSrc: Oral   Oral  BMI (Calculated): 32.78     Initial head CT was negative for any cranial hemorrhage. Labs are notable for : -Initial EKG shows sinus rhythm 92 with no ST-T wave changes. - Basic metabolic panel is within normal limits serum bicarb of 20 creatinine of 0.86. -Troponins within normal limits at 15 x 2. - Initial CBC shows normal white count of 6.1 hemoglobin of 15 and platelet count of 231..  In the ED pt received: Medications  heparin injection 5,000 Units (5,000 Units Subcutaneous Given 01/21/23 2234)  sodium chloride flush (NS) 0.9 % injection 3 mL (3 mLs Intravenous  Given 01/21/23 2235)  acetaminophen (TYLENOL) tablet 650 mg (has no administration in time range)    Or  acetaminophen (TYLENOL) suppository 650 mg (has no administration in time range)  HYDROcodone-acetaminophen (NORCO/VICODIN) 5-325 MG per tablet 1 tablet (has no administration in time range)  morphine (PF) 2 MG/ML injection 2 mg (has no administration in time range)  nicotine (NICODERM CQ - dosed in mg/24 hours) patch 21 mg (21 mg Transdermal Patch Applied 01/21/23 2233)  nitroGLYCERIN (NITROSTAT) SL tablet 0.4 mg (has no administration in time range)  amitriptyline (ELAVIL) tablet 50 mg (50 mg Oral Given 01/21/23 2234)  hydrochlorothiazide (HYDRODIURIL) tablet 25 mg (25 mg Oral Given 01/21/23 2234)  albuterol (PROVENTIL) (2.5 MG/3ML) 0.083% nebulizer solution 2.5 mg (has no administration in time range)  pantoprazole (PROTONIX) EC tablet 40 mg (40 mg Oral Given 01/21/23 2234)  diltiazem (CARDIZEM SR) 12 hr capsule 60 mg (60 mg Oral Given 01/21/23 2234)  hydrALAZINE (APRESOLINE) injection 10 mg (has no administration in time range)  labetalol (NORMODYNE) injection 10 mg (10 mg Intravenous Given 01/21/23 1735)  metoCLOPramide (REGLAN) injection 10 mg (10 mg Intravenous Given 01/21/23 1732)  diphenhydrAMINE (BENADRYL) injection 12.5 mg (12.5 mg Intravenous Given 01/21/23 1731)  iohexol (OMNIPAQUE) 350 MG/ML injection 100 mL (100 mLs Intravenous Contrast Given 01/21/23 1759)  labetalol (NORMODYNE) injection 20 mg (20 mg Intravenous Given 01/21/23 2008)   Review of Systems  Constitutional: Negative.   HENT: Negative.    Eyes:  Negative.   Respiratory: Negative.    Cardiovascular: Negative.   Gastrointestinal:  Positive for abdominal pain and nausea.  Genitourinary: Negative.   Musculoskeletal: Negative.   Skin: Negative.   Neurological:  Positive for headaches.   Past Medical History:  Diagnosis Date   Acute cholecystitis due to biliary calculus 03/15/2018   Hypertension    Migraine with aura  08/08/2015   Tarsal tunnel syndrome    Tarsal tunnel syndrome of left side 08/08/2015   Tobacco abuse 08/08/2015   Past Surgical History:  Procedure Laterality Date   CHOLECYSTECTOMY N/A 03/16/2018   Procedure: LAPAROSCOPIC CHOLECYSTECTOMY;  Surgeon: Ancil Linsey, MD;  Location: [facility] ORS;  Service: General;  Laterality: N/A;   FOOT SURGERY Left    X's Three   TUBAL LIGATION      reports that she quit smoking about 4 years ago. Her smoking use included cigarettes. She started smoking about 24 years ago. She has a 10 pack-year smoking history. She has never used smokeless tobacco. She reports current drug use. Drug: Marijuana. She reports that she does not drink alcohol.  Allergies  Allergen Reactions   Ibuprofen Swelling    Family History  Problem Relation Age of Onset   COPD Mother    Heart failure Mother    Hypertension Mother    Hypertension Father    Heart disease Father    Hypertension Sister    Hypertension Brother     Prior to Admission medications   Medication Sig Start Date End Date Taking? Authorizing Provider  amitriptyline (ELAVIL) 50 MG tablet Take 1 tablet (50 mg total) by mouth at bedtime. 08/12/17   Tukov-Yual, Alroy Bailiff, NP  amLODipine (NORVASC) 10 MG tablet Take 1 tablet (10 mg total) by mouth daily. 09/19/22 10/19/22  Merwyn Katos, MD  cetirizine (ZYRTEC) 10 MG tablet TAKE ONE TABLET BY MOUTH EVERY DAY. REPLACES LORATADINE Patient taking differently: Take 10 mg by mouth daily.  09/11/16   Virl Axe, MD  hydrochlorothiazide (HYDRODIURIL) 25 MG tablet Take 1 tablet (25 mg total) by mouth daily. 01/13/22   Pilar Jarvis, MD  hydrOXYzine (ATARAX) 25 MG tablet Take 25 mg by mouth every 6 (six) hours as needed for anxiety or itching.    [provider]  hydrOXYzine (VISTARIL) 25 MG capsule Take 1 capsule (25 mg total) by mouth every 6 (six) hours as needed for itching. Patient not taking: Reported on 09/19/2022 01/13/22   Pilar Jarvis, MD  omeprazole  (PRILOSEC) 20 MG capsule Take 1 capsule (20 mg total) by mouth 2 (two) times daily before a meal. Patient taking differently: Take 40 mg by mouth daily. 08/12/17   Tukov-Yual, Alroy Bailiff, NP  ondansetron (ZOFRAN-ODT) 4 MG disintegrating tablet Take 1 tablet (4 mg total) by mouth every 8 (eight) hours as needed. Patient not taking: Reported on 09/19/2022 02/10/22   Georga Hacking, MD  propranolol (INDERAL) 40 MG tablet Take 1 tablet (40 mg total) by mouth 2 (two) times daily. Patient taking differently: Take 80 mg by mouth 2 (two) times daily. 01/13/22   Pilar Jarvis, MD  VENTOLIN HFA 108 [phone] Base) MCG/ACT inhaler INHALE 2 PUFFS INTO THE LUNGS EVERY 6 HOURS AS NEEDED FOR WHEEZING ORSHORTNESS OF BREATH. 05/12/18   Andreas Ohm, NP    Vitals:   01/21/23 1648 01/21/23 1930 01/21/23 2100 01/21/23 2238  BP: (!) 226/115 (!) 194/109 (!) 174/93   Pulse: 87 76 71   Resp: 16 (!) 23 (!) 21  Temp: 98.2 F (36.8 C)   98.3 F (36.8 C)  TempSrc: Oral   Oral  SpO2: 100% 100% 99%   Weight: 89.4 kg     Height: 5\' 5"  (1.651 m)      Physical Exam Vitals and nursing note reviewed.  Constitutional:      General: She is not in acute distress. HENT:     Head: Normocephalic and atraumatic.     Right Ear: Hearing normal.     Left Ear: Hearing normal.     Nose: Nose normal. No nasal deformity.     Mouth/Throat:     Lips: Pink.     Tongue: No lesions.     Pharynx: Oropharynx is clear.  Eyes:     General: Lids are normal.     Extraocular Movements: Extraocular movements intact.  Cardiovascular:     Rate and Rhythm: Normal rate and regular rhythm.     Heart sounds: Normal heart sounds.  Pulmonary:     Effort: Pulmonary effort is normal.     Breath sounds: Normal breath sounds.  Abdominal:     General: Bowel sounds are normal. There is no distension.     Palpations: Abdomen is soft. There is no mass.     Tenderness: There is no abdominal tenderness.  Musculoskeletal:     Right lower leg:  No edema.     Left lower leg: No edema.  Skin:    General: Skin is warm.  Neurological:     General: No focal deficit present.     Mental Status: She is alert and oriented to person, place, and time.     Cranial Nerves: Cranial nerves 2-12 are intact.  Psychiatric:        Attention and Perception: Attention normal.        Mood and Affect: Mood normal.        Speech: Speech normal.        Behavior: Behavior normal. Behavior is cooperative.      Labs on Admission: I have personally reviewed following labs and imaging studies Results for orders placed or performed during the hospital encounter of 01/21/23 (from the past 24 hour(s))  Basic metabolic panel     Status: Abnormal   Collection Time: 01/21/23  4:50 PM  Result Value Ref Range   Sodium 140 135 - 145 mmol/L   Potassium 3.7 3.5 - 5.1 mmol/L   Chloride 111 98 - 111 mmol/L   CO2 20 (L) 22 - 32 mmol/L   Glucose, Bld 99 70 - 99 mg/dL   BUN 17 6 - 20 mg/dL   Creatinine, Ser 1.61 0.44 - 1.00 mg/dL   Calcium 8.9 8.9 - 09.6 mg/dL   GFR, Estimated >04 >54 mL/min   Anion gap 9 5 - 15  CBC     Status: Abnormal   Collection Time: 01/21/23  4:50 PM  Result Value Ref Range   WBC 6.1 4.0 - 10.5 K/uL   RBC 5.30 (H) 3.87 - 5.11 MIL/uL   Hemoglobin 15.0 12.0 - 15.0 g/dL   HCT 09.8 11.9 - 14.7 %   MCV 86.8 80.0 - 100.0 fL   MCH 28.3 26.0 - 34.0 pg   MCHC 32.6 30.0 - 36.0 g/dL   RDW 82.9 56.2 - 13.0 %   Platelets 231 150 - 400 K/uL   nRBC 0.0 0.0 - 0.2 %  Troponin I (High Sensitivity)     Status: None   Collection Time: 01/21/23  4:50 PM  Result Value Ref Range   Troponin I (High Sensitivity) 15 <18 ng/L  hCG, quantitative, pregnancy     Status: None   Collection Time: 01/21/23  4:50 PM  Result Value Ref Range   hCG, Beta Chain, Quant, S <1 <5 mIU/mL  Urine Drug Screen, Qualitative ([facility] only)     Status: Abnormal   Collection Time: 01/21/23  7:25 PM  Result Value Ref Range   Tricyclic, Ur Screen NONE DETECTED NONE DETECTED    Amphetamines, Ur Screen NONE DETECTED NONE DETECTED   MDMA (Ecstasy)Ur Screen NONE DETECTED NONE DETECTED   Cocaine Metabolite,Ur [place] NONE DETECTED NONE DETECTED   Opiate, Ur Screen NONE DETECTED NONE DETECTED   Phencyclidine (PCP) Ur S NONE DETECTED NONE DETECTED   Cannabinoid 50 Ng, Ur [place] POSITIVE (A) NONE DETECTED   Barbiturates, Ur Screen NONE DETECTED NONE DETECTED   Benzodiazepine, Ur Scrn NONE DETECTED NONE DETECTED   Methadone Scn, Ur NONE DETECTED NONE DETECTED  Troponin I (High Sensitivity)     Status: None   Collection Time: 01/21/23  7:26 PM  Result Value Ref Range   Troponin I (High Sensitivity) 15 <18 ng/L    CBC: Recent Labs  Lab 01/21/23 1650  WBC 6.1  HGB 15.0  HCT 46.0  MCV 86.8  PLT 231   Basic Metabolic Panel: Recent Labs  Lab 01/21/23 1650  NA 140  K 3.7  CL 111  CO2 20*  GLUCOSE 99  BUN 17  CREATININE 0.86  CALCIUM 8.9   Urinalysis    Component Value Date/Time   COLORURINE YELLOW (A) 02/10/2022 0940   APPEARANCEUR CLOUDY (A) 02/10/2022 0940   APPEARANCEUR Clear 08/12/2013 1822   LABSPEC 1.027 02/10/2022 0940   LABSPEC 1.030 08/12/2013 1822   PHURINE 5.0 02/10/2022 0940   GLUCOSEU NEGATIVE 02/10/2022 0940   GLUCOSEU Negative 08/12/2013 1822   HGBUR NEGATIVE 02/10/2022 0940   BILIRUBINUR NEGATIVE 02/10/2022 0940   BILIRUBINUR Negative 08/12/2013 1822   KETONESUR NEGATIVE 02/10/2022 0940   PROTEINUR 100 (A) 02/10/2022 0940   NITRITE NEGATIVE 02/10/2022 0940   LEUKOCYTESUR NEGATIVE 02/10/2022 0940   LEUKOCYTESUR Negative 08/12/2013 1822    Unresulted Labs (From admission, onward)     Start     Ordered   01/22/23 0500  Comprehensive metabolic panel  Tomorrow morning,   R        01/21/23 2111   01/22/23 0500  CBC  Tomorrow morning,   R        01/21/23 2111   01/21/23 2157  Ethanol  Add-on,   AD        01/21/23 2157   01/21/23 2107  HIV Antibody (routine testing w rflx)  (HIV Antibody (Routine testing w reflex) panel)  Once,   R         01/21/23 2111            Medications  heparin injection 5,000 Units (5,000 Units Subcutaneous Given 01/21/23 2234)  sodium chloride flush (NS) 0.9 % injection 3 mL (3 mLs Intravenous Given 01/21/23 2235)  acetaminophen (TYLENOL) tablet 650 mg (has no administration in time range)    Or  acetaminophen (TYLENOL) suppository 650 mg (has no administration in time range)  HYDROcodone-acetaminophen (NORCO/VICODIN) 5-325 MG per tablet 1 tablet (has no administration in time range)  morphine (PF) 2 MG/ML injection 2 mg (has no administration in time range)  nicotine (NICODERM CQ - dosed in mg/24 hours) patch 21 mg (21 mg Transdermal Patch Applied 01/21/23 2233)  nitroGLYCERIN (  NITROSTAT) SL tablet 0.4 mg (has no administration in time range)  amitriptyline (ELAVIL) tablet 50 mg (50 mg Oral Given 01/21/23 2234)  hydrochlorothiazide (HYDRODIURIL) tablet 25 mg (25 mg Oral Given 01/21/23 2234)  albuterol (PROVENTIL) (2.5 MG/3ML) 0.083% nebulizer solution 2.5 mg (has no administration in time range)  pantoprazole (PROTONIX) EC tablet 40 mg (40 mg Oral Given 01/21/23 2234)  diltiazem (CARDIZEM SR) 12 hr capsule 60 mg (60 mg Oral Given 01/21/23 2234)  hydrALAZINE (APRESOLINE) injection 10 mg (has no administration in time range)  labetalol (NORMODYNE) injection 10 mg (10 mg Intravenous Given 01/21/23 1735)  metoCLOPramide (REGLAN) injection 10 mg (10 mg Intravenous Given 01/21/23 1732)  diphenhydrAMINE (BENADRYL) injection 12.5 mg (12.5 mg Intravenous Given 01/21/23 1731)  iohexol (OMNIPAQUE) 350 MG/ML injection 100 mL (100 mLs Intravenous Contrast Given 01/21/23 1759)  labetalol (NORMODYNE) injection 20 mg (20 mg Intravenous Given 01/21/23 2008)    Radiological Exams on Admission: CT Angio Chest/Abd/Pel for Dissection W and/or Wo Contrast  Result Date: 01/21/2023 CLINICAL DATA:  Acute aortic syndrome (AAS) suspected. Chest pain. Elevated blood pressure. EXAM: CT ANGIOGRAPHY CHEST, ABDOMEN AND PELVIS  TECHNIQUE: Non-contrast CT of the chest was initially obtained. Multidetector CT imaging through the chest, abdomen and pelvis was performed using the standard protocol during bolus administration of intravenous contrast. Multiplanar reconstructed images and MIPs were obtained and reviewed to evaluate the vascular anatomy. RADIATION DOSE REDUCTION: This exam was performed according to the departmental dose-optimization program which includes automated exposure control, adjustment of the mA and/or kV according to patient size and/or use of iterative reconstruction technique. CONTRAST:  OMNIPAQUE IOHEXOL 350 MG/ML SOLN COMPARISON:  None Available. FINDINGS: CTA CHEST FINDINGS Cardiovascular: No intramural hematoma noted in the thoracic aorta on the unenhanced images. Thoracic aorta is normal in caliber without aneurysm, dissection, vasculitis or significant stenosis. Normal cardiac size. No pericardial effusion. No aortic aneurysm. Mediastinum/Nodes: Visualized thyroid gland appears grossly unremarkable. No solid / cystic mediastinal masses. The esophagus is nondistended precluding optimal assessment. No axillary, mediastinal or hilar lymphadenopathy by size criteria. Lungs/Pleura: The central tracheo-bronchial tree is patent. There is mild, smooth, circumferential thickening of the segmental and subsegmental bronchial walls, throughout bilateral lungs, which is nonspecific. Findings are most commonly seen with bronchitis or reactive airway disease, such as asthma. No mass or consolidation. No pleural effusion or pneumothorax. No suspicious lung nodules. Musculoskeletal: The visualized soft tissues of the chest wall are grossly unremarkable. No suspicious osseous lesions. There are mild multilevel degenerative changes in the visualized spine. Review of the MIP images confirms the above findings. CTA ABDOMEN AND PELVIS FINDINGS VASCULAR Aorta: Normal caliber aorta without aneurysm, dissection, vasculitis or  significant stenosis. Celiac: Patent without evidence of aneurysm, dissection, vasculitis or significant stenosis. SMA: Patent without evidence of aneurysm, dissection, vasculitis or significant stenosis. Renals: Both renal arteries are patent without evidence of aneurysm, dissection, vasculitis, fibromuscular dysplasia or significant stenosis. IMA: Patent without evidence of aneurysm, dissection, vasculitis or significant stenosis. Inflow: Patent without evidence of aneurysm, dissection, vasculitis or significant stenosis. Veins: No obvious venous abnormality within the limitations of this arterial phase study. Review of the MIP images confirms the above findings. NON-VASCULAR Hepatobiliary: The liver is normal in size. Non-cirrhotic configuration. No suspicious mass. No intrahepatic or extrahepatic bile duct dilation. Gallbladder is surgically absent. Pancreas: Unremarkable. No pancreatic ductal dilatation or surrounding inflammatory changes. Spleen: Within normal limits. No focal lesion. Adrenals/Urinary Tract: Adrenal glands are unremarkable. No suspicious renal mass. No hydronephrosis. No renal or ureteric calculi.  Urinary bladder is under distended, precluding optimal assessment. However, no large mass or stones identified. No perivesical fat stranding. Stomach/Bowel: No disproportionate dilation of the small or large bowel loops. No evidence of abnormal bowel wall thickening or inflammatory changes. The appendix is unremarkable. There are scattered diverticula mainly in the left hemi colon, without imaging signs of diverticulitis. Vascular/Lymphatic: No ascites or pneumoperitoneum. No abdominal or pelvic lymphadenopathy, by size criteria. No aneurysmal dilation of the major abdominal arteries. Reproductive: The uterus is unremarkable. No large adnexal mass. Other: There is a small fat containing umbilical hernia. The soft tissues and abdominal wall are otherwise unremarkable. Musculoskeletal: No suspicious  osseous lesions. There are mild multilevel degenerative changes in the visualized spine. Review of the MIP images confirms the above findings. IMPRESSION: *No acute aortic syndrome. No intramural hematoma in the thoracic aorta. No aneurysm, dissection or penetrating atherosclerotic ulcer of the aorta. *Multiple other nonacute observations, as described above. Electronically Signed   By: Jules Schick M.D.   On: 01/21/2023 18:40   CT HEAD WO CONTRAST ( )  Result Date: 01/21/2023 CLINICAL DATA:  Sudden headache. EXAM: CT HEAD WITHOUT CONTRAST TECHNIQUE: Contiguous axial images were obtained from the base of the skull through the vertex without intravenous contrast. RADIATION DOSE REDUCTION: This exam was performed according to the departmental dose-optimization program which includes automated exposure control, adjustment of the mA and/or kV according to patient size and/or use of iterative reconstruction technique. COMPARISON:  CT scan head from 09/19/2022. FINDINGS: Brain: No evidence of acute infarction, hemorrhage, hydrocephalus, extra-axial collection or mass lesion/mass effect. Ventricles are normal. Cerebral volume is age appropriate. Vascular: No hyperdense vessel or unexpected calcification. Skull: Normal. Negative for fracture. Redemonstration of focal expansile remodeling of the left anterior temporal bone, similar to prior studies. Sinuses/Orbits: No acute intracranial abnormality. Other: Visualized mastoid air cells are unremarkable. No mastoid effusion. IMPRESSION: *No acute intracranial abnormality.  No subarachnoid hemorrhage. Electronically Signed   By: Jules Schick M.D.   On: 01/21/2023 18:31   DG Chest 2 View  Result Date: 01/21/2023 CLINICAL DATA:  Chest pain. EXAM: CHEST - 2 VIEW COMPARISON:  02/10/2002. FINDINGS: Bilateral lung fields are clear. Bilateral costophrenic angles are clear. Normal cardio-mediastinal silhouette. No acute osseous abnormalities. The soft tissues are within  normal limits. IMPRESSION: No active cardiopulmonary disease. Electronically Signed   By: Jules Schick M.D.   On: 01/21/2023 18:26     Data Reviewed: Relevant notes from primary care and specialist visits, past discharge summaries as available in EHR, including Care Everywhere. Prior diagnostic testing as pertinent to current admission diagnoses Updated medications and problem lists for reconciliation ED course, including vitals, labs, imaging, treatment and response to treatment Triage notes, nursing and pharmacy notes and ED provider's notes Notable results as noted in HPI  Assessment & Plan Hypertensive emergency without congestive heart failure Limited patient blood pressure monitoring and obtain 2D echocardiogram.  Prior medication regimen consisted of HCTZ 25 amlodipine, propranolol. Will continue patient on HCTZ, start patient on trial of diltiazem 60 mg every 12.  Monitor overnight on this regimen and then resume any additional home meds if necessary. Tobacco abuse counseling Counseled patient on the effect of tobacco and elevated blood pressures and the need for her to quit.  Patient states that she is ready to quit and her family is going to want the same for her now. Chest tightness Patient states that she was having chest tightness that was anterior midsternal not radiating anywhere no nausea or vomiting  associated with it.  Patient underwent a CT angio of chest abdomen and pelvis heart dissection evaluation and PE eval.  Result shows; *No acute aortic syndrome. No intramural hematoma in the thoracic aorta. No aneurysm, dissection or penetrating atherosclerotic ulcer of the aorta. *Multiple other nonacute observations, as described above. Troponins negative, EKG negative for ST changes or T wave inversions.  Sinus rhythm 92 biatrial enlargement PR of 176, QTc 479. We will further obtain echo for chest tightness. Will defer ischemic workup for once bp is controlled.  Pulmicort  nebs for 3 dose for mild wheezing.    DVT prophylaxis:  Heparin.  Consults:  None   Advance Care Planning:    Code Status: Full Code   Family Communication:  None   Disposition Plan:  Home.   Severity of Illness: The appropriate patient status for this patient is INPATIENT. Inpatient status is judged to be reasonable and necessary in order to provide the required intensity of service to ensure the patient's safety. The patient's presenting symptoms, physical exam findings, and initial radiographic and laboratory data in the context of their chronic comorbidities is felt to place them at high risk for further clinical deterioration. Furthermore, it is not anticipated that the patient will be medically stable for discharge from the hospital within 2 midnights of admission.   * I certify that at the point of admission it is my clinical judgment that the patient will require inpatient hospital care spanning beyond 2 midnights from the point of admission due to high intensity of service, high risk for further deterioration and high frequency of surveillance required.*  Author: Gertha Calkin, MD 01/22/2023 1:08 AM  For on call review [email or web address].

## 2023-01-21 NOTE — ED Provider Notes (Signed)
[facility] Provider Note    Event Date/Time   First MD Initiated Contact with Patient 01/21/23 1711     (approximate)   History   Chest Pain and Hypertension   HPI  Brandi Swanson is a 47 y.o. female with retention who comes in with concerns for elevated blood pressure.  I reviewed some notes where she has been seen before for elevated blood pressure.  She is on hydroxyzine, hydrochlorothiazide, amlodipine, propranolol.  She reports being compliant with her medications.  She reports that around 3 PM she has had a onset of bad headache as well as chest pain.  She reports history of migraines but this 1 felt worse.  She is tearful secondary to the discomfort and pain.  She denies any abdominal pain.  Denies the pain radiating into her back.   Physical Exam   Triage Vital Signs: ED Triage Vitals [01/21/23 1648]  Encounter Vitals Group     BP (!) 226/115     Systolic BP Percentile      Diastolic BP Percentile      Pulse Rate 87     Resp 16     Temp 98.2 F (36.8 C)     Temp Source Oral     SpO2 100 %     Weight 197 lb (89.4 kg)     Height 5\' 5"  (1.651 m)     Head Circumference      Peak Flow      Pain Score 10     Pain Loc      Pain Education      Exclude from Growth Chart     Most recent vital signs: Vitals:   01/21/23 1648  BP: (!) 226/115  Pulse: 87  Resp: 16  Temp: 98.2 F (36.8 C)  SpO2: 100%     General: Awake, no distress.  CV:  Good peripheral perfusion.  Resp:  Normal effort.  Abd:  No distention.  Other:  Patient is tearful.  Cranial nerves appear intact.  Equal strength in arms and legs.   ED Results / Procedures / Treatments   Labs (all labs ordered are listed, but only abnormal results are displayed) Labs Reviewed  BASIC METABOLIC PANEL - Abnormal; Notable for the following components:      Result Value   CO2 20 (*)    All other components within normal limits  CBC - Abnormal; Notable for the following  components:   RBC 5.30 (*)    All other components within normal limits  HCG, QUANTITATIVE, PREGNANCY  POC URINE PREG, ED  TROPONIN I (HIGH SENSITIVITY)  TROPONIN I (HIGH SENSITIVITY)     EKG  My interpretation of EKG:  Normal sinus rate 92 without any significant ST elevation or T wave inversions other than V2, normal intervals  RADIOLOGY I have reviewed the CT head personally and interpreted and no obvious intercranial hemorrhage.   PROCEDURES:  Critical Care performed: Yes, see critical care procedure note(s)  .Critical Care  Performed by: Concha Se, MD Authorized by: Concha Se, MD   Critical care provider statement:    Critical care time (minutes):  30   Critical care was necessary to treat or prevent imminent or life-threatening deterioration of the following conditions: HTN emergency.   Critical care was time spent personally by me on the following activities:  Development of treatment plan with patient or surrogate, discussions with consultants, evaluation of patient's response to treatment, examination of patient,  ordering and review of laboratory studies, ordering and review of radiographic studies, ordering and performing treatments and interventions, pulse oximetry, re-evaluation of patient's condition and review of old charts .1-3 Lead EKG Interpretation  Performed by: Concha Se, MD Authorized by: Concha Se, MD     Interpretation: normal     ECG rate:  80   ECG rate assessment: normal     Rhythm: sinus rhythm     Ectopy: none     Conduction: normal      MEDICATIONS ORDERED IN ED: Medications  labetalol (NORMODYNE) injection 10 mg (has no administration in time range)  metoCLOPramide (REGLAN) injection 10 mg (10 mg Intravenous Given 01/21/23 1732)  diphenhydrAMINE (BENADRYL) injection 12.5 mg (12.5 mg Intravenous Given 01/21/23 1731)     IMPRESSION / MDM / ASSESSMENT AND PLAN / ED COURSE  I reviewed the triage vital signs and the nursing  notes.   Patient's presentation is most consistent with acute presentation with potential threat to life or bodily function.   Patient comes in significantly hypertensive with headache, chest pain.  CT imaging will be ordered evaluate for intracranial hemorrhage, CT dissection to evaluate for dissection.  I recommended getting an IV first to give it some treatment for her elevated blood pressure with some labetalol, migraine cocktail.  Given her blood work is already back and she has normal kidney function we do not need to send her for CT head and then come back and go later for CT dissection.  They can do this all at once.  There is any difficulties getting IV however will send patient more emergently for the CT head.  She declined pregnancy testing and that she has had an IV tubal ligation.  Given her headache started within less than 6 hours before CT imaging I suspect that this is very good at ruling out intracranial hemorrhage.  5:58 PM patient is got an IV medication and is going to CT now Troponin was negative.  BMP similar to prior.  CBC normal.  7:14 PM repeat evaluation patient reports feeling much better.  8:10 PM patient still having a headache.  Initially got better but started get worse again.  Will give another 20 of IV labetalol.  Given her continued intermittent headaches with significant hypertension we will discuss with hospitalist for admission  The patient is on the cardiac monitor to evaluate for evidence of arrhythmia and/or significant heart rate changes.      FINAL CLINICAL IMPRESSION(S) / ED DIAGNOSES   Final diagnoses:  Hypertensive urgency     Rx / DC Orders   ED Discharge Orders     None        Note:  This document was prepared using Dragon voice recognition software and may include unintentional dictation errors.   Concha Se, MD 01/21/23 2044

## 2023-01-21 NOTE — ED Triage Notes (Signed)
Pt to ED via POV. Pt states that she was getting off work and started to have chest pain. Pt states that her blood pressure is also elevated. Pt reports that she has not missed any of her medication.

## 2023-01-22 ENCOUNTER — Observation Stay (HOSPITAL_COMMUNITY)
Admit: 2023-01-22 | Discharge: 2023-01-22 | Disposition: A | Discharge status: Home / Self Care | Payer: 59 | Attending: Internal Medicine | Admitting: Internal Medicine

## 2023-01-22 ENCOUNTER — Observation Stay
Admit: 2023-01-22 | Discharge: 2023-01-22 | Disposition: A | Discharge status: Home / Self Care | Payer: 59 | Attending: Internal Medicine | Admitting: Internal Medicine

## 2023-01-22 DIAGNOSIS — I517 Cardiomegaly: Secondary | ICD-10-CM

## 2023-01-22 DIAGNOSIS — Z716 Tobacco abuse counseling: Secondary | ICD-10-CM | POA: Diagnosis not present

## 2023-01-22 DIAGNOSIS — Z8249 Family history of ischemic heart disease and other diseases of the circulatory system: Secondary | ICD-10-CM | POA: Diagnosis not present

## 2023-01-22 DIAGNOSIS — Z1152 Encounter for screening for COVID-19: Secondary | ICD-10-CM | POA: Diagnosis not present

## 2023-01-22 DIAGNOSIS — M940 Chondrocostal junction syndrome [Tietze]: Secondary | ICD-10-CM | POA: Diagnosis present

## 2023-01-22 DIAGNOSIS — I959 Hypotension, unspecified: Secondary | ICD-10-CM | POA: Diagnosis not present

## 2023-01-22 DIAGNOSIS — R079 Chest pain, unspecified: Secondary | ICD-10-CM

## 2023-01-22 DIAGNOSIS — Z886 Allergy status to analgesic agent status: Secondary | ICD-10-CM | POA: Diagnosis not present

## 2023-01-22 DIAGNOSIS — J449 Chronic obstructive pulmonary disease, unspecified: Secondary | ICD-10-CM | POA: Diagnosis present

## 2023-01-22 DIAGNOSIS — F17210 Nicotine dependence, cigarettes, uncomplicated: Secondary | ICD-10-CM | POA: Diagnosis present

## 2023-01-22 DIAGNOSIS — R0789 Other chest pain: Secondary | ICD-10-CM | POA: Diagnosis present

## 2023-01-22 DIAGNOSIS — Z79899 Other long term (current) drug therapy: Secondary | ICD-10-CM | POA: Diagnosis not present

## 2023-01-22 DIAGNOSIS — I161 Hypertensive emergency: Secondary | ICD-10-CM | POA: Diagnosis present

## 2023-01-22 DIAGNOSIS — F32A Depression, unspecified: Secondary | ICD-10-CM | POA: Diagnosis present

## 2023-01-22 DIAGNOSIS — I119 Hypertensive heart disease without heart failure: Secondary | ICD-10-CM | POA: Diagnosis present

## 2023-01-22 DIAGNOSIS — I160 Hypertensive urgency: Principal | ICD-10-CM

## 2023-01-22 DIAGNOSIS — Z825 Family history of asthma and other chronic lower respiratory diseases: Secondary | ICD-10-CM | POA: Diagnosis not present

## 2023-01-22 DIAGNOSIS — I10 Essential (primary) hypertension: Secondary | ICD-10-CM

## 2023-01-22 DIAGNOSIS — Z888 Allergy status to other drugs, medicaments and biological substances status: Secondary | ICD-10-CM | POA: Diagnosis not present

## 2023-01-22 DIAGNOSIS — F419 Anxiety disorder, unspecified: Secondary | ICD-10-CM | POA: Diagnosis present

## 2023-01-22 DIAGNOSIS — G44221 Chronic tension-type headache, intractable: Secondary | ICD-10-CM

## 2023-01-22 DIAGNOSIS — Z5986 Financial insecurity: Secondary | ICD-10-CM | POA: Diagnosis not present

## 2023-01-22 DIAGNOSIS — G43109 Migraine with aura, not intractable, without status migrainosus: Secondary | ICD-10-CM | POA: Diagnosis present

## 2023-01-22 DIAGNOSIS — K219 Gastro-esophageal reflux disease without esophagitis: Secondary | ICD-10-CM | POA: Diagnosis present

## 2023-01-22 DIAGNOSIS — Z5982 Transportation insecurity: Secondary | ICD-10-CM | POA: Diagnosis not present

## 2023-01-22 DIAGNOSIS — E872 Acidosis, unspecified: Secondary | ICD-10-CM | POA: Diagnosis present

## 2023-01-22 DIAGNOSIS — Y990 Civilian activity done for income or pay: Secondary | ICD-10-CM | POA: Diagnosis not present

## 2023-01-22 LAB — COMPREHENSIVE METABOLIC PANEL
ALT: 15 U/L (ref 0–44)
AST: 16 U/L (ref 15–41)
Albumin: 4.2 g/dL (ref 3.5–5.0)
Alkaline Phosphatase: 47 U/L (ref 38–126)
Anion gap: 8 (ref 5–15)
BUN: 12 mg/dL (ref 6–20)
CO2: 20 mmol/L — ABNORMAL LOW (ref 22–32)
Calcium: 8.4 mg/dL — ABNORMAL LOW (ref 8.9–10.3)
Chloride: 107 mmol/L (ref 98–111)
Creatinine, Ser: 0.72 mg/dL (ref 0.44–1.00)
GFR, Estimated: >60 mL/min (ref >60)
Glucose, Bld: 102 mg/dL — ABNORMAL HIGH (ref 70–99)
Potassium: 3.5 mmol/L (ref 3.5–5.1)
Sodium: 135 mmol/L (ref 135–145)
Total Bilirubin: 0.9 mg/dL (ref <1.2)
Total Protein: 7.4 g/dL (ref 6.5–8.1)

## 2023-01-22 LAB — ECHOCARDIOGRAM COMPLETE
AR max vel: 3.02 cm2
AV Area VTI: 2.85 cm2
AV Area mean vel: 2.91 cm2
AV Mean grad: 4.3 mm[Hg]
AV Peak grad: 8.8 mm[Hg]
Ao pk vel: 1.49 m/s
Area-P 1/2: 4.31 cm2
Calc EF: 52 %
Est EF: 50
Height: 65 in
MV VTI: 5.16 cm2
S' Lateral: 3.5 cm
Single Plane A2C EF: 42.2 %
Single Plane A4C EF: 57.4 %
Weight: 3152 [oz_av]

## 2023-01-22 LAB — CBC
HCT: 43.4 % (ref 36.0–46.0)
Hemoglobin: 14.2 g/dL (ref 12.0–15.0)
MCH: 28 pg (ref 26.0–34.0)
MCHC: 32.7 g/dL (ref 30.0–36.0)
MCV: 85.4 fL (ref 80.0–100.0)
Platelets: 213 10*3/uL (ref 150–400)
RBC: 5.08 MIL/uL (ref 3.87–5.11)
RDW: 14.9 % (ref 11.5–15.5)
WBC: 4.5 10*3/uL (ref 4.0–10.5)
nRBC: 0 % (ref 0.0–0.2)

## 2023-01-22 LAB — ETHANOL: Alcohol, Ethyl (B): <10 mg/dL (ref <10)

## 2023-01-22 LAB — TSH: TSH: 2.016 u[IU]/mL (ref 0.350–4.500)

## 2023-01-22 LAB — HIV ANTIBODY (ROUTINE TESTING W REFLEX): HIV Screen 4th Generation wRfx: NONREACTIVE

## 2023-01-22 MED ORDER — HYDRALAZINE HCL 50 MG PO TABS
50.0000 mg | ORAL_TABLET | Freq: Three times a day (TID) | ORAL | Status: DC
  Administered 2023-01-22 – 2023-01-25 (×9): 50 mg via ORAL
  Filled 2023-01-22 (×9): qty 1

## 2023-01-22 MED ORDER — BUDESONIDE 0.25 MG/2ML IN SUSP
0.2500 mg | Freq: Two times a day (BID) | RESPIRATORY_TRACT | Status: AC
  Administered 2023-01-22 (×2): 0.25 mg via RESPIRATORY_TRACT
  Filled 2023-01-22 (×2): qty 2

## 2023-01-22 MED ORDER — CYCLOBENZAPRINE HCL 5 MG PO TABS
7.5000 mg | ORAL_TABLET | Freq: Three times a day (TID) | ORAL | Status: DC | PRN
  Administered 2023-01-22 – 2023-01-25 (×3): 7.5 mg via ORAL
  Filled 2023-01-22 (×6): qty 1.5

## 2023-01-22 MED ORDER — AMLODIPINE BESYLATE 5 MG PO TABS: 10.0000 mg | ORAL_TABLET | Freq: Every day | ORAL | Status: DC

## 2023-01-22 MED ORDER — MORPHINE SULFATE (PF) 2 MG/ML IV SOLN
2.0000 mg | INTRAVENOUS | Status: DC | PRN
  Administered 2023-01-22 – 2023-01-24 (×3): 2 mg via INTRAVENOUS
  Filled 2023-01-22 (×3): qty 1

## 2023-01-22 MED ORDER — IRBESARTAN 150 MG PO TABS
150.0000 mg | ORAL_TABLET | Freq: Every day | ORAL | Status: DC
  Administered 2023-01-22 – 2023-01-25 (×4): 150 mg via ORAL
  Filled 2023-01-22 (×4): qty 1

## 2023-01-22 MED ORDER — SUMATRIPTAN SUCCINATE 50 MG PO TABS
50.0000 mg | ORAL_TABLET | ORAL | Status: DC | PRN
  Administered 2023-01-23: 50 mg via ORAL
  Filled 2023-01-22: qty 1

## 2023-01-22 MED ORDER — NICARDIPINE HCL IN NACL 20-0.86 MG/200ML-% IV SOLN
3.0000 mg/h | INTRAVENOUS | Status: DC
  Administered 2023-01-22 (×3): 5 mg/h via INTRAVENOUS
  Administered 2023-01-23: 3 mg/h via INTRAVENOUS
  Filled 2023-01-22 (×4): qty 200

## 2023-01-22 MED ORDER — SODIUM CHLORIDE 0.9 % IV SOLN
25.0000 mg | Freq: Four times a day (QID) | INTRAVENOUS | Status: DC | PRN
  Administered 2023-01-22: 25 mg via INTRAVENOUS
  Filled 2023-01-22: qty 25

## 2023-01-22 MED ORDER — LOSARTAN POTASSIUM 50 MG PO TABS: 100.0000 mg | ORAL_TABLET | Freq: Every day | ORAL | Status: DC

## 2023-01-22 MED ORDER — ONDANSETRON HCL 4 MG/2ML IJ SOLN
4.0000 mg | Freq: Four times a day (QID) | INTRAMUSCULAR | Status: DC | PRN
  Administered 2023-01-22 (×2): 4 mg via INTRAVENOUS
  Filled 2023-01-22 (×2): qty 2

## 2023-01-22 NOTE — ED Notes (Signed)
Dr Sharol Harness for additional n/v meds. Pt continues vomiting

## 2023-01-22 NOTE — ED Notes (Signed)
Dr Marcell Anger for orders for zofran. Notified of inability to give PO pain meds. No orders due for SBP >170 or IV pain meds.

## 2023-01-22 NOTE — ED Notes (Signed)
Pt with episode of urinary incontinence while vomiting. Linens changed. Gown changed. Wipes provided

## 2023-01-22 NOTE — Consult Note (Signed)
Cardiology Consultation   Patient ID: Brandi Swanson MRN: [id number]; DOB: [birth year]  Admit date: 01/21/2023 Date of Consult: 01/22/2023  PCP:  Hillery Aldo, MD   [facility] Providers Cardiologist:  Julien Nordmann, MD   {   Patient Profile:   Brandi Swanson is a 47 y.o. female with a hx of tobacco use, HTN, allergies, COPD, GERD, depression/anxiety, migraines who is being seen 01/22/2023 for the evaluation of chest pain and elevated BP at the request of Dr. Meriam Sprague.  History of Present Illness:   Brandi Swanson has not been seen by cardiology in the past. 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 Brandi Swanson smokes 3-4 ppd. No alcohol or drug history. Brandi Swanson reports compliance with medications.   The patient presented to [facility] 01/21/23 with chest pain and headache. Brandi Swanson reports chest tightness that is worse when Brandi Swanson breaths in . Brandi Swanson has tenderness to palpation on Brandi Swanson chest and goes around the left side to the back. It hurts to move in general. Brandi Swanson has MSK back pain through Brandi Swanson whole back. Brandi Swanson has been heavy lifting a work recently. History of headache most of the month.   In the ER BP BP 226/115, pulse 87bpm, RR 16, afebrile, 100%O2. CXR nonacute. CT head was negative. Ct chest was negative for acute syndrome.  EKG showed NSR with LVH. HS troponin negative x 2.UDS showed cannabinoid. Labs showed bicarb 20 and Scr 0.86, K 3.7,  Hgb 15, plt 231, WBC 6.1. The patient was given IV labetolol with improvement of blood pressures.    Past Medical History:  Diagnosis Date   Acute cholecystitis due to biliary calculus 03/15/2018   Hypertension    Migraine with aura 08/08/2015   Tarsal tunnel syndrome    Tarsal tunnel syndrome of left side 08/08/2015   Tobacco abuse 08/08/2015    Past Surgical History:  Procedure Laterality Date   CHOLECYSTECTOMY N/A 03/16/2018   Procedure: LAPAROSCOPIC CHOLECYSTECTOMY;  Surgeon: Ancil Linsey, MD;  Location: [facility] ORS;  Service: General;  Laterality: N/A;   FOOT  SURGERY Left    X's Three   TUBAL LIGATION       Home Medications:  Prior to Admission medications   Medication Sig Start Date End Date Taking? Authorizing Provider  amitriptyline (ELAVIL) 50 MG tablet Take 1 tablet (50 mg total) by mouth at bedtime. 08/12/17  Yes Tukov-Yual, Magdalene S, NP  amLODipine (NORVASC) 10 MG tablet Take 1 tablet (10 mg total) by mouth daily. 09/19/22 01/22/23 Yes Merwyn Katos, MD  aspirin-acetaminophen-caffeine (EXCEDRIN MIGRAINE) [phone] MG tablet Take 1 tablet by mouth every 6 (six) hours as needed for headache.   Yes [provider]  celecoxib (CELEBREX) 100 MG capsule Take 100 mg by mouth 2 (two) times daily. 12/29/22  Yes [provider]  hydrochlorothiazide (HYDRODIURIL) 50 MG tablet Take 50 mg by mouth daily. 09/21/22  Yes [provider]  hydrOXYzine (ATARAX) 25 MG tablet Take 25 mg by mouth every 6 (six) hours as needed for anxiety or itching.   Yes [provider]  omeprazole (PRILOSEC) 40 MG capsule Take 40 mg by mouth daily. 08/27/22  Yes [provider]  propranolol (INDERAL) 80 MG tablet Take 80 mg by mouth 2 (two) times daily. 08/27/22  Yes [provider]  SUMAtriptan (IMITREX) 50 MG tablet Take 50 mg by mouth as directed.  TAKE 1 TABLET BY MOUTH NOW, MAY REPEAT IN 2 HOURS. NO MORE THAN 4 TABLETS (200MG )PER DAY. 09/21/22  Yes [provider]  VENTOLIN HFA 108 (90 Base) MCG/ACT inhaler INHALE 2 PUFFS INTO THE LUNGS EVERY 6 HOURS AS NEEDED FOR WHEEZING ORSHORTNESS OF BREATH. 05/12/18  Yes Tukov-Yual, Magdalene S, NP  cetirizine (ZYRTEC) 10 MG tablet TAKE ONE TABLET BY MOUTH EVERY DAY. REPLACES LORATADINE Patient not taking: Reported on 01/22/2023 09/11/16   Virl Axe, MD  hydrochlorothiazide (HYDRODIURIL) 25 MG tablet Take 1 tablet (25 mg total) by mouth daily. Patient not taking: Reported on 01/22/2023 01/13/22   Pilar Jarvis, MD  hydrOXYzine (VISTARIL) 25 MG capsule Take 1 capsule (25 mg  total) by mouth every 6 (six) hours as needed for itching. Patient not taking: Reported on 09/19/2022 01/13/22   Pilar Jarvis, MD  omeprazole (PRILOSEC) 20 MG capsule Take 1 capsule (20 mg total) by mouth 2 (two) times daily before a meal. Patient not taking: Reported on 01/22/2023 08/12/17   Tukov-Yual, Alroy Bailiff, NP  ondansetron (ZOFRAN-ODT) 4 MG disintegrating tablet Take 1 tablet (4 mg total) by mouth every 8 (eight) hours as needed. Patient not taking: Reported on 09/19/2022 02/10/22   Georga Hacking, MD  propranolol (INDERAL) 40 MG tablet Take 1 tablet (40 mg total) by mouth 2 (two) times daily. Patient taking differently: Take 80 mg by mouth 2 (two) times daily. 01/13/22   Pilar Jarvis, MD    Inpatient Medications: Scheduled Meds:  amitriptyline  50 mg Oral QHS   budesonide (PULMICORT) nebulizer solution  0.25 mg Nebulization BID   diltiazem  60 mg Oral Q12H   heparin  5,000 Units Subcutaneous Q12H   hydrochlorothiazide  25 mg Oral Daily   losartan  100 mg Oral Daily   nicotine  21 mg Transdermal Daily   pantoprazole  40 mg Oral Daily   sodium chloride flush  3 mL Intravenous Q12H   Continuous Infusions:  PRN Meds: acetaminophen **OR** acetaminophen, albuterol, hydrALAZINE, HYDROcodone-acetaminophen, morphine injection, nitroGLYCERIN, ondansetron (ZOFRAN) IV  Allergies:    Allergies  Allergen Reactions   Ibuprofen Swelling    Social History:   Social History   Socioeconomic History   Marital status: Divorced    Spouse name: Not on file   Number of children: Not on file   Years of education: Not on file   Highest education level: Not on file  Occupational History   Not on file  Tobacco Use   Smoking status: Former    Current packs/day: 0.00    Average packs/day: 0.5 packs/day for 20.0 years (10.0 ttl pk-yrs)    Types: Cigarettes    Start date: 03/19/1998    Quit date: 03/19/2018    Years since quitting: 4.8   Smokeless tobacco: Never  Substance and Sexual Activity    Alcohol use: No    Alcohol/week: 0.0 standard drinks of alcohol   Drug use: Yes    Types: Marijuana   Sexual activity: Not on file  Other Topics Concern   Not on file  Social History Narrative   Lives with Brandi Swanson son.   Walks 10+ miles to get to work   People can sometimes help give rides   -- food and bills are a huge concern to Brandi Swanson       Patient signed inform consent to release information for possible refferals    Social Determinants of Health   Financial Resource Strain: High Risk (08/12/2017)   Overall Financial Resource Strain (CARDIA)    Difficulty of Paying Living Expenses: Very hard  Food Insecurity: Food Insecurity Present (08/12/2017)   Hunger Vital  Sign    Worried About Programme researcher, broadcasting/film/video in the Last Year: Often true    Ran Out of Food in the Last Year: Often true  Transportation Needs: Unmet Transportation Needs (08/12/2017)   PRAPARE - Administrator, Civil Service (Medical): Yes    Lack of Transportation (Non-Medical): Yes  Physical Activity: Sufficiently Active (08/12/2017)   Exercise Vital Sign    Days of Exercise per Week: 4 days    Minutes of Exercise per Session: 50 min  Stress: Stress Concern Present (08/12/2017)   [employer] of Occupational Health - Occupational Stress Questionnaire    Feeling of Stress : Very much  Social Connections: Unknown (08/12/2017)   Social Connection and Isolation Panel [NHANES]    Frequency of Communication with Friends and Family: Three times a week    Frequency of Social Gatherings with Friends and Family: Three times a week    Attends Religious Services: 1 to 4 times per year    Active Member of Clubs or Organizations: No    Attends Banker Meetings: Never    Marital Status: Not on file  Intimate Partner Violence: Not At Risk (08/12/2017)   Humiliation, Afraid, Rape, and Kick questionnaire    Fear of Current or Ex-Partner: No    Emotionally Abused: No    Physically Abused: No    Sexually  Abused: No    Family History:    Family History  Problem Relation Age of Onset   COPD Mother    Heart failure Mother    Hypertension Mother    Hypertension Father    Heart disease Father    Hypertension Sister    Hypertension Brother      ROS:  Please see the history of present illness.   All other ROS reviewed and negative.     Physical Exam/Data:   Vitals:   01/22/23 0952 01/22/23 1003 01/22/23 1030 01/22/23 1100  BP:  (!) 216/77 (!) 183/86 (!) 142/71  Pulse:  83 91 85  Resp:  (!) 34 (!) 33 (!) 30  Temp: 98.3 F (36.8 C)     TempSrc:      SpO2:  100% 100% 99%  Weight:      Height:       No intake or output data in the 24 hours ending 01/22/23 1200    01/21/2023    4:48 PM 02/10/2022    9:38 AM 10/15/2019    2:23 AM  Last 3 Weights  Weight (lbs) 197 lb 165 lb 190 lb  Weight (kg) 89.359 kg 74.844 kg 86.183 kg     Body mass index is 32.78 kg/m.  General:  Well nourished, well developed, in no acute distress HEENT: normal Neck: no JVD Vascular: No carotid bruits; Distal pulses 2+ bilaterally Cardiac:  normal S1, S2; RRR; no murmur  Lungs:  clear to auscultation bilaterally, no wheezing, rhonchi or rales  Abd: soft, nontender, no hepatomegaly  Ext: no edema Musculoskeletal:  TTP on mid chest area and entire back Skin: warm and dry  Neuro:  CNs 2-12 intact, no focal abnormalities noted Psych:  Normal affect   EKG:  The EKG was personally reviewed and demonstrates:  NSR 92bpm, nonspecific St changes Telemetry:  Telemetry was personally reviewed and demonstrates:  N/A  Relevant CV Studies:  Echo ordered  Laboratory Data:  High Sensitivity Troponin:   Recent Labs  Lab 01/21/23 1650 01/21/23 1926  TROPONINIHS 15 15     Chemistry Recent Labs  Lab 01/21/23 1650 01/22/23 0410  NA 140 135  K 3.7 3.5  CL 111 107  CO2 20* 20*  GLUCOSE 99 102*  BUN 17 12  CREATININE 0.86 0.72  CALCIUM 8.9 8.4*  GFRNONAA >60 >60  ANIONGAP 9 8    Recent Labs   Lab 01/22/23 0410  PROT 7.4  ALBUMIN 4.2  AST 16  ALT 15  ALKPHOS 47  BILITOT 0.9   Lipids No results for input(s): "CHOL", "TRIG", "HDL", "LABVLDL", "LDLCALC", "CHOLHDL" in the last 168 hours.  Hematology Recent Labs  Lab 01/21/23 1650 01/22/23 0410  WBC 6.1 4.5  RBC 5.30* 5.08  HGB 15.0 14.2  HCT 46.0 43.4  MCV 86.8 85.4  MCH 28.3 28.0  MCHC 32.6 32.7  RDW 14.8 14.9  PLT 231 213   Thyroid No results for input(s): "TSH", "FREET4" in the last 168 hours.  BNPNo results for input(s): "BNP", "PROBNP" in the last 168 hours.  DDimer No results for input(s): "DDIMER" in the last 168 hours.   Radiology/Studies:  CT Angio Chest/Abd/Pel for Dissection W and/or Wo Contrast  Result Date: 01/21/2023 CLINICAL DATA:  Acute aortic syndrome (AAS) suspected. Chest pain. Elevated blood pressure. EXAM: CT ANGIOGRAPHY CHEST, ABDOMEN AND PELVIS TECHNIQUE: Non-contrast CT of the chest was initially obtained. Multidetector CT imaging through the chest, abdomen and pelvis was performed using the standard protocol during bolus administration of intravenous contrast. Multiplanar reconstructed images and MIPs were obtained and reviewed to evaluate the vascular anatomy. RADIATION DOSE REDUCTION: This exam was performed according to the departmental dose-optimization program which includes automated exposure control, adjustment of the mA and/or kV according to patient size and/or use of iterative reconstruction technique. CONTRAST:  OMNIPAQUE IOHEXOL 350 MG/ML SOLN COMPARISON:  None Available. FINDINGS: CTA CHEST FINDINGS Cardiovascular: No intramural hematoma noted in the thoracic aorta on the unenhanced images. Thoracic aorta is normal in caliber without aneurysm, dissection, vasculitis or significant stenosis. Normal cardiac size. No pericardial effusion. No aortic aneurysm. Mediastinum/Nodes: Visualized thyroid gland appears grossly unremarkable. No solid / cystic mediastinal masses. The esophagus is  nondistended precluding optimal assessment. No axillary, mediastinal or hilar lymphadenopathy by size criteria. Lungs/Pleura: The central tracheo-bronchial tree is patent. There is mild, smooth, circumferential thickening of the segmental and subsegmental bronchial walls, throughout bilateral lungs, which is nonspecific. Findings are most commonly seen with bronchitis or reactive airway disease, such as asthma. No mass or consolidation. No pleural effusion or pneumothorax. No suspicious lung nodules. Musculoskeletal: The visualized soft tissues of the chest wall are grossly unremarkable. No suspicious osseous lesions. There are mild multilevel degenerative changes in the visualized spine. Review of the MIP images confirms the above findings. CTA ABDOMEN AND PELVIS FINDINGS VASCULAR Aorta: Normal caliber aorta without aneurysm, dissection, vasculitis or significant stenosis. Celiac: Patent without evidence of aneurysm, dissection, vasculitis or significant stenosis. SMA: Patent without evidence of aneurysm, dissection, vasculitis or significant stenosis. Renals: Both renal arteries are patent without evidence of aneurysm, dissection, vasculitis, fibromuscular dysplasia or significant stenosis. IMA: Patent without evidence of aneurysm, dissection, vasculitis or significant stenosis. Inflow: Patent without evidence of aneurysm, dissection, vasculitis or significant stenosis. Veins: No obvious venous abnormality within the limitations of this arterial phase study. Review of the MIP images confirms the above findings. NON-VASCULAR Hepatobiliary: The liver is normal in size. Non-cirrhotic configuration. No suspicious mass. No intrahepatic or extrahepatic bile duct dilation. Gallbladder is surgically absent. Pancreas: Unremarkable. No pancreatic ductal dilatation or surrounding inflammatory changes. Spleen: Within normal limits. No focal lesion.  Adrenals/Urinary Tract: Adrenal glands are unremarkable. No suspicious renal  mass. No hydronephrosis. No renal or ureteric calculi. Urinary bladder is under distended, precluding optimal assessment. However, no large mass or stones identified. No perivesical fat stranding. Stomach/Bowel: No disproportionate dilation of the small or large bowel loops. No evidence of abnormal bowel wall thickening or inflammatory changes. The appendix is unremarkable. There are scattered diverticula mainly in the left hemi colon, without imaging signs of diverticulitis. Vascular/Lymphatic: No ascites or pneumoperitoneum. No abdominal or pelvic lymphadenopathy, by size criteria. No aneurysmal dilation of the major abdominal arteries. Reproductive: The uterus is unremarkable. No large adnexal mass. Other: There is a small fat containing umbilical hernia. The soft tissues and abdominal wall are otherwise unremarkable. Musculoskeletal: No suspicious osseous lesions. There are mild multilevel degenerative changes in the visualized spine. Review of the MIP images confirms the above findings. IMPRESSION: *No acute aortic syndrome. No intramural hematoma in the thoracic aorta. No aneurysm, dissection or penetrating atherosclerotic ulcer of the aorta. *Multiple other nonacute observations, as described above. Electronically Signed   By: Jules Schick M.D.   On: 01/21/2023 18:40   CT HEAD WO CONTRAST ( )  Result Date: 01/21/2023 CLINICAL DATA:  Sudden headache. EXAM: CT HEAD WITHOUT CONTRAST TECHNIQUE: Contiguous axial images were obtained from the base of the skull through the vertex without intravenous contrast. RADIATION DOSE REDUCTION: This exam was performed according to the departmental dose-optimization program which includes automated exposure control, adjustment of the mA and/or kV according to patient size and/or use of iterative reconstruction technique. COMPARISON:  CT scan head from 09/19/2022. FINDINGS: Brain: No evidence of acute infarction, hemorrhage, hydrocephalus, extra-axial collection or mass  lesion/mass effect. Ventricles are normal. Cerebral volume is age appropriate. Vascular: No hyperdense vessel or unexpected calcification. Skull: Normal. Negative for fracture. Redemonstration of focal expansile remodeling of the left anterior temporal bone, similar to prior studies. Sinuses/Orbits: No acute intracranial abnormality. Other: Visualized mastoid air cells are unremarkable. No mastoid effusion. IMPRESSION: *No acute intracranial abnormality.  No subarachnoid hemorrhage. Electronically Signed   By: Jules Schick M.D.   On: 01/21/2023 18:31   DG Chest 2 View  Result Date: 01/21/2023 CLINICAL DATA:  Chest pain. EXAM: CHEST - 2 VIEW COMPARISON:  02/10/2002. FINDINGS: Bilateral lung fields are clear. Bilateral costophrenic angles are clear. Normal cardio-mediastinal silhouette. No acute osseous abnormalities. The soft tissues are within normal limits. IMPRESSION: No active cardiopulmonary disease. Electronically Signed   By: Jules Schick M.D.   On: 01/21/2023 18:26     Assessment and Plan:   Hypertensive emergency - the patient presented with chest pain and headache found to have severely elevated blood pressure - PTA amlodipine, hydrochlorothiazide and propranolol> Brandi Swanson reports compliance with meds - CXR negative and chest CT non-acute - s/p IV labetalol with improvement - started on Losartan>>will switch to Valsartan - stop hydrochlorothiazide - restart amlodipine 10mg  daily - can stop dilt - echo pending, but unofficial read is unremarkable - BP so far improved.  - elevated BP possibly from severe MSK pain vs headaches/migraines. Brandi Swanson will need continued management of BP as OP  Chest pain - Brandi Swanson has MSK pain that is reproducible with palpation. It goes around to Brandi Swanson back as well - HS trop negative x 2 - EKG with NSR and LVH - Chest CT with no significant cardiac medications - no further ischemic work-up at this time  Tobacco use - Brandi Swanson smokes 3-4 ppd - cessation  recommended   For questions or updates, please contact  [facility] Please consult [email or web address] for contact info under    Signed, Jameer Storie David Stall, PA-C  01/22/2023 12:00 PM

## 2023-01-22 NOTE — ED Notes (Signed)
Daughter asking Medic student questions, refusing to speak to this RN while RN in room. Daughter visualized walking out of room and slamming sliding door shut while cursing staff.  First RN made aware daughter not to be allowed back in for visiting d/t safety concerns.

## 2023-01-22 NOTE — ED Notes (Signed)
Dr Meriam Sprague messaged to notify of BP 207/101 and not due for prn medications currently.  Pt currently resting with RR even and unlabored.

## 2023-01-22 NOTE — ED Notes (Signed)
Rn to bedside to attempt to give scheduled PO meds. Daughter at bedside questioned "why you keep giving my mama the same stuff?" This RN questioned what family meant, and stated pt not receiving the same medications. Family began yelling at this RN saying "this is my fucking mama I can ask questions, you can meet me outside is what you can do". RN informed family that could not speak to me this way. Family heard speaking on phone saying "this nurse wont even give her an IV and keeps giving her the same meds" This RN attempted to clarify that pt does have an IV, has received different IV medications and attempting to give different PO meds, family yelling at this RN "can't you see I'm on the phone, let me not talk to you, let me get away from you" . This RN attempted to explain situation to family, family continues to be hostile. Security Reuel Boom to bedside for RN safety d/t daughter's threats.  RN addressed any potential concerns with pt, pt denies concerns or questions and apologized for daughter's behavior.  This RN went to nurses station, daughter visualized pointing phone at this RN to take pictures/video. Daniel Security notified and to bedside.  Charge Heather aware of situation

## 2023-01-22 NOTE — ED Notes (Signed)
Per Dr Meriam Sprague ok to give another dose of morphine now, frequency changed. Per Dr Meriam Sprague give another dose of 10mg  IV hydralazine now

## 2023-01-22 NOTE — Assessment & Plan Note (Addendum)
Limited patient blood pressure monitoring and obtain 2D echocardiogram.  Prior medication regimen consisted of HCTZ 25 amlodipine, propranolol. Will continue patient on HCTZ, start patient on trial of diltiazem 60 mg every 12.  Monitor overnight on this regimen and then resume any additional home meds if necessary.

## 2023-01-22 NOTE — ED Notes (Signed)
Pt crying, c/o continued h/a. Attempted to give norco, pt began vomiting

## 2023-01-22 NOTE — ED Notes (Addendum)
Pt c/o 6/10 headache, requesting meds

## 2023-01-22 NOTE — Assessment & Plan Note (Addendum)
Patient states that she was having chest tightness that was anterior midsternal not radiating anywhere no nausea or vomiting associated with it.  Patient underwent a CT angio of chest abdomen and pelvis heart dissection evaluation and PE eval.  Result shows; *No acute aortic syndrome. No intramural hematoma in the thoracic aorta. No aneurysm, dissection or penetrating atherosclerotic ulcer of the aorta. *Multiple other nonacute observations, as described above. Troponins negative, EKG negative for ST changes or T wave inversions.  Sinus rhythm 92 biatrial enlargement PR of 176, QTc 479. We will further obtain echo for chest tightness. Will defer ischemic workup for once bp is controlled.  Pulmicort nebs for 3 dose for mild wheezing.

## 2023-01-22 NOTE — ED Notes (Signed)
Dr Meriam Sprague notified of bp 177/79, not time for prn orders. Also notified of continued h/a. Orders placed

## 2023-01-22 NOTE — ED Notes (Signed)
Pharmacy contacted for phenergan  

## 2023-01-22 NOTE — Assessment & Plan Note (Addendum)
Counseled patient on the effect of tobacco and elevated blood pressures and the need for her to quit.  Patient states that she is ready to quit and her family is going to want the same for her now.

## 2023-01-22 NOTE — Progress Notes (Signed)
Progress Note   Patient: Brandi Swanson YNW:[id number] DOB: 1975-03-05 DOA: 01/21/2023     0 DOS: the patient was seen and examined on 01/22/2023   Brief hospital course: Brandi Swanson is a 47 y.o. female with medical history significant for essential hypertension, tobacco abuse, allergies to ibuprofen, COPD, acid reflux/GERD, depression/anxiety, migraine headaches with aura presenting today with complaints of chest pain and elevated blood pressures.  Chest pain started while patient was getting off of work.  Patient ensures compliance with her medication regimen for high blood pressure.  In the beginning of the chest pain started and her blood pressure was high he started to have also of severe headache along with the chest pain.  Admission requested for hypertensive emergency with chest pain and headaches.   Assessment and Plan: Hypertensive emergency without congestive heart failure Given persistence of hypertension nicardipine drip has been initiated Monitor blood pressure closely We will admit patient to stepdown Echocardiogram showed ejection fraction of 50% with no diastolic dysfunction Cardiologist consulted and case discussed  Migraine headaches Continue home medications  Tobacco abuse Patient counseled on cessation  Chest pain This has been discussed with cardiologist and at this point thought to be due to musculoskeletal EKG unremarkable Troponins within normal limit Monitor on telemetry     DVT prophylaxis:  Heparin.   Consults: Cardiology   Advance Care Planning:    Code Status: Full Code   Subjective:  Patient seen and examined at bedside this morning She complained of pain on the left side of her chest Later in the day patient also had complaints of nausea and vomiting as well as migraine headache Blood pressure has been very difficult to control Cardiologist consulted and have seen patient and have made adjustments to antihypertensives  Physical  Exam:  General: Laying in bed in no acute distress Eyes:     General: Lids are normal.     Extraocular Movements: Extraocular movements intact.  Cardiovascular:     Rate and Rhythm: Normal rate and regular rhythm.     Heart sounds: Normal heart sounds.  Pulmonary:     Effort: Pulmonary effort is normal.     Breath sounds: Normal breath sounds.  Abdominal:     General: Bowel sounds are normal. There is no distension.     Palpations: Abdomen is soft. There is no mass.     Tenderness: There is no abdominal tenderness.  Musculoskeletal:     Right lower leg: No edema.     Left lower leg: No edema.  Skin:    General: Skin is warm.  Neurological:     General: No focal deficit present.     Mental Status: She is alert and oriented to person, place, and time.     Cranial Nerves: Cranial nerves 2-12 are intact.  Psychiatric:    Normal  Vitals:   01/22/23 1359 01/22/23 1400 01/22/23 1500 01/22/23 1530  BP:  (!) 190/109 (!) 199/105 (!) 207/101  Pulse:  (!) 102 91 87  Resp:  (!) 32 (!) 27 (!) 26  Temp: 98.6 F (37 C)     TempSrc:      SpO2:  99% 98% 98%  Weight:      Height:        Data Reviewed:    Latest Ref Rng & Units 01/22/2023    4:10 AM 01/21/2023    4:50 PM 09/19/2022    3:52 PM  CBC  WBC 4.0 - 10.5 K/uL 4.5  6.1  5.1   Hemoglobin 12.0 - 15.0 g/dL 16.1  09.6  04.5   Hematocrit 36.0 - 46.0 % 43.4  46.0  47.4   Platelets 150 - 400 K/uL 213  231  279        Latest Ref Rng & Units 01/22/2023    4:10 AM 01/21/2023    4:50 PM 09/19/2022    3:52 PM  BMP  Glucose 70 - 99 mg/dL 409  99  811   BUN 6 - 20 mg/dL 12  17  13    Creatinine 0.44 - 1.00 mg/dL 9.14  7.82  9.56   Sodium 135 - 145 mmol/L 135  140  139   Potassium 3.5 - 5.1 mmol/L 3.5  3.7  3.4   Chloride 98 - 111 mmol/L 107  111  109   CO2 22 - 32 mmol/L 20  20  20    Calcium 8.9 - 10.3 mg/dL 8.4  8.9  9.1       Family Communication: No family at bedside at this time  Disposition: Status is: Inpatient  Time  spent: 56 minutes  Author: Loyce Dys, MD 01/22/2023 3:55 PM  For on call review [email or web address].

## 2023-01-22 NOTE — ED Notes (Signed)
Pt crying, c/o 9/10 headache

## 2023-01-22 NOTE — Progress Notes (Signed)
*  PRELIMINARY RESULTS* Echocardiogram 2D Echocardiogram has been performed.  Brandi Swanson 01/22/2023, 10:29 AM

## 2023-01-23 ENCOUNTER — Other Ambulatory Visit: Payer: Self-pay

## 2023-01-23 DIAGNOSIS — I517 Cardiomegaly: Secondary | ICD-10-CM

## 2023-01-23 DIAGNOSIS — R072 Precordial pain: Secondary | ICD-10-CM

## 2023-01-23 DIAGNOSIS — I161 Hypertensive emergency: Secondary | ICD-10-CM | POA: Diagnosis not present

## 2023-01-23 DIAGNOSIS — R079 Chest pain, unspecified: Secondary | ICD-10-CM | POA: Diagnosis present

## 2023-01-23 LAB — CBC WITH DIFFERENTIAL/PLATELET
Abs Immature Granulocytes: 0.02 10*3/uL (ref 0.00–0.07)
Basophils Absolute: 0 10*3/uL (ref 0.0–0.1)
Basophils Relative: 0 %
Eosinophils Absolute: 0 10*3/uL (ref 0.0–0.5)
Eosinophils Relative: 0 %
HCT: 45.1 % (ref 36.0–46.0)
Hemoglobin: 15.2 g/dL — ABNORMAL HIGH (ref 12.0–15.0)
Immature Granulocytes: 0 %
Lymphocytes Relative: 18 %
Lymphs Abs: 1.3 10*3/uL (ref 0.7–4.0)
MCH: 28.7 pg (ref 26.0–34.0)
MCHC: 33.7 g/dL (ref 30.0–36.0)
MCV: 85.1 fL (ref 80.0–100.0)
Monocytes Absolute: 0.7 10*3/uL (ref 0.1–1.0)
Monocytes Relative: 10 %
Neutro Abs: 5.3 10*3/uL (ref 1.7–7.7)
Neutrophils Relative %: 72 %
Platelets: 227 10*3/uL (ref 150–400)
RBC: 5.3 MIL/uL — ABNORMAL HIGH (ref 3.87–5.11)
RDW: 14.9 % (ref 11.5–15.5)
WBC: 7.4 10*3/uL (ref 4.0–10.5)
nRBC: 0 % (ref 0.0–0.2)

## 2023-01-23 LAB — BLOOD GAS, VENOUS
Acid-base deficit: 2.6 mmol/L — ABNORMAL HIGH (ref 0.0–2.0)
Bicarbonate: 19.9 mmol/L — ABNORMAL LOW (ref 20.0–28.0)
O2 Saturation: 95.1 %
Patient temperature: 37
pCO2, Ven: 28 mm[Hg] — ABNORMAL LOW (ref 44–60)
pH, Ven: 7.46 — ABNORMAL HIGH (ref 7.25–7.43)
pO2, Ven: 75 mm[Hg] — ABNORMAL HIGH (ref 32–45)

## 2023-01-23 LAB — BASIC METABOLIC PANEL
Anion gap: 11 (ref 5–15)
BUN: 19 mg/dL (ref 6–20)
CO2: 17 mmol/L — ABNORMAL LOW (ref 22–32)
Calcium: 8.5 mg/dL — ABNORMAL LOW (ref 8.9–10.3)
Chloride: 107 mmol/L (ref 98–111)
Creatinine, Ser: 0.99 mg/dL (ref 0.44–1.00)
GFR, Estimated: >60 mL/min (ref >60)
Glucose, Bld: 105 mg/dL — ABNORMAL HIGH (ref 70–99)
Potassium: 3.4 mmol/L — ABNORMAL LOW (ref 3.5–5.1)
Sodium: 135 mmol/L (ref 135–145)

## 2023-01-23 LAB — TROPONIN I (HIGH SENSITIVITY)
Troponin I (High Sensitivity): 106 ng/L (ref <18)
Troponin I (High Sensitivity): 121 ng/L (ref <18)

## 2023-01-23 LAB — MAGNESIUM: Magnesium: 2.2 mg/dL (ref 1.7–2.4)

## 2023-01-23 MED ORDER — AMLODIPINE BESYLATE 10 MG PO TABS
10.0000 mg | ORAL_TABLET | Freq: Every day | ORAL | Status: DC
  Administered 2023-01-23 – 2023-01-25 (×3): 10 mg via ORAL
  Filled 2023-01-23: qty 1
  Filled 2023-01-23: qty 2
  Filled 2023-01-23: qty 1

## 2023-01-23 MED ORDER — PAROXETINE HCL 20 MG PO TABS
40.0000 mg | ORAL_TABLET | Freq: Every day | ORAL | Status: DC
  Administered 2023-01-24 – 2023-01-25 (×2): 40 mg via ORAL
  Filled 2023-01-23 (×3): qty 2

## 2023-01-23 MED ORDER — POTASSIUM CHLORIDE CRYS ER 20 MEQ PO TBCR
40.0000 meq | EXTENDED_RELEASE_TABLET | Freq: Once | ORAL | Status: AC
  Administered 2023-01-23: 40 meq via ORAL
  Filled 2023-01-23: qty 2

## 2023-01-23 NOTE — ED Notes (Addendum)
Patient given meds for headache and nausea. Cardene gtt rate changed to help with further BP control. VSS, CCM in use, call light within reach. No other needs identified at this time.

## 2023-01-23 NOTE — Progress Notes (Signed)
       CROSS COVER NOTE  NAME: Brandi Swanson MRN: [id number] DOB : Jun 03, 1975    Concern as stated by nurse / staff    Hi this patient got up to go to the restroom and back to bed. She now complaints of 10/10 sharp chest pain under the left breast. Completed repeat EKG. What would you like further?     Pertinent findings on chart review:   Assessment and  Interventions   Assessment:    01/23/2023    6:09 AM 01/23/2023    5:45 AM 01/23/2023    5:30 AM  Vitals with BMI  Systolic 139 127 782  Diastolic 74 71 74  Pulse  111 956   Metabolic acidosis and elevated hemoglobin on am labs Plan: Discontinue cardene SL nitroglycerin Mag level Troponin vbg       Donnie Mesa NP [facility] Cross Cover 7pm-7am - check amion for availability Pager [phone]

## 2023-01-23 NOTE — ED Notes (Signed)
Patient did not have change in CP with SL nitroglycerin. Repeat EKG obtained and notified Jon Billings, NP.

## 2023-01-23 NOTE — ED Notes (Signed)
Cardene gtt rate changed back down to 3mg /hr to to keep BP in normal range. Patient back to resting quietly with eyes closed, VSS, CCM in use, call light within reach. No other needs identified at this time.

## 2023-01-23 NOTE — ED Notes (Signed)
New Cardene bag hung. Morning labs drawn. Patient back to resting quietly with eyes closed, VSS, CCM in use, call light within reach.

## 2023-01-23 NOTE — ED Notes (Signed)
Breathing treatment provided to patient. Cardene gtt changed to 3mg /hr due to BP being in the 120s/130s systolic.

## 2023-01-23 NOTE — ED Notes (Signed)
1610 This Patient's family member came to Nurse desk to notify that patient was having chest pain. This RN entered room to assess. Patient complaints of 10/10 chest pain under the left breast. Repeat EKG obtained by 0553 and signed by Dolores Frame, MD in ER of no STEMI. Notified Jon Billings, NP and orders obtained to turn off cardene, administer SL nitroglycerin, and obtain labs.

## 2023-01-23 NOTE — Progress Notes (Signed)
Progress Note   Patient: Brandi Swanson [id number]:[id number] DOB: Aug 28, 1975 DOA: 01/21/2023     1 DOS: the patient was seen and examined on 01/23/2023    Brief hospital course: Brandi Swanson is a 47 y.o. female with medical history significant for essential hypertension, tobacco abuse, allergies to ibuprofen, COPD, acid reflux/GERD, depression/anxiety, migraine headaches with aura presenting today with complaints of chest pain and elevated blood pressures.  Chest pain started while patient was getting off of work.  Patient ensures compliance with her medication regimen for high blood pressure.  In the beginning of the chest pain started and her blood pressure was high he started to have also of severe headache along with the chest pain.  Admission requested for hypertensive emergency with chest pain and headaches.     Assessment and Plan: Hypertensive emergency without congestive heart failure Patient transition from stepdown to med/tele Echocardiogram showed ejection fraction of 50% with no diastolic dysfunction Overnight patient required Cardene drip which is now discontinued Cardiologist consulted and case discussed   Migraine headaches Headache much better today Continue home medications   Tobacco abuse Patient counseled on cessation   Chest pain This has been discussed with cardiologist and at this point thought to be due to musculoskeletal related to carrying of heavy items at work EKG unremarkable Troponin slightly elevated secondary to hypotension which is now improved Monitor on telemetry     DVT prophylaxis:  Heparin.   Consults: Cardiology   Advance Care Planning:    Code Status: Full Code    Subjective:  Patient seen and examined at bedside this morning Patient had episodes of chest pain overnight requiring repeat imaging as well as troponin which have been reviewed She admits to improvement in the chest pain as well as headache this morning Denies nausea  vomiting abdominal pain or cough   Physical Exam:   General: Laying in bed in no acute distress Eyes:     General: Lids are normal.     Extraocular Movements: Extraocular movements intact.  Cardiovascular:     Rate and Rhythm: Normal rate and regular rhythm.     Heart sounds: Normal heart sounds.  Pulmonary:     Effort: Pulmonary effort is normal.     Breath sounds: Normal breath sounds.  Abdominal:     General: Bowel sounds are normal. There is no distension.     Palpations: Abdomen is soft. There is no mass.     Tenderness: There is no abdominal tenderness.  Musculoskeletal:     Right lower leg: No edema.     Left lower leg: No edema.  Skin:    General: Skin is warm.  Neurological:     General: No focal deficit present.     Mental Status: She is alert and oriented to person, place, and time.     Cranial Nerves: Cranial nerves 2-12 are intact.  Psychiatric:    Normal   Family Communication: No family at bedside at this time   Disposition: Status is: Inpatient   Time spent: 46 minutes   Data Reviewed:      Latest Ref Rng & Units 01/23/2023    4:46 AM 01/22/2023    4:10 AM 01/21/2023    4:50 PM  CBC  WBC 4.0 - 10.5 K/uL 7.4  4.5  6.1   Hemoglobin 12.0 - 15.0 g/dL 41.3  24.4  01.0   Hematocrit 36.0 - 46.0 % 45.1  43.4  46.0   Platelets 150 - 400 K/uL  227  213  231        Latest Ref Rng & Units 01/23/2023    4:46 AM 01/22/2023    4:10 AM 01/21/2023    4:50 PM  BMP  Glucose 70 - 99 mg/dL 161  096  99   BUN 6 - 20 mg/dL 19  12  17    Creatinine 0.44 - 1.00 mg/dL 0.45  4.09  8.11   Sodium 135 - 145 mmol/L 135  135  140   Potassium 3.5 - 5.1 mmol/L 3.4  3.5  3.7   Chloride 98 - 111 mmol/L 107  107  111   CO2 22 - 32 mmol/L 17  20  20    Calcium 8.9 - 10.3 mg/dL 8.5  8.4  8.9     Vitals:   01/23/23 1200 01/23/23 1215 01/23/23 1230 01/23/23 1328  BP: (!) 152/72 125/70 137/77 128/70  Pulse: (!) 112 (!) 114 (!) 107 (!) 108  Resp: 20 (!) 31 (!) 28 16  Temp:    (!)  97.5 F (36.4 C)  TempSrc:    Oral  SpO2: 99% 99% 98% 100%  Weight:      Height:         Author: Loyce Dys, MD 01/23/2023 2:39 PM  For on call review [email or web address].

## 2023-01-24 DIAGNOSIS — I161 Hypertensive emergency: Secondary | ICD-10-CM | POA: Diagnosis not present

## 2023-01-24 LAB — CBC WITH DIFFERENTIAL/PLATELET
Abs Immature Granulocytes: 0.01 10*3/uL (ref 0.00–0.07)
Basophils Absolute: 0 10*3/uL (ref 0.0–0.1)
Basophils Relative: 0 %
Eosinophils Absolute: 0.1 10*3/uL (ref 0.0–0.5)
Eosinophils Relative: 1 %
HCT: 47.9 % — ABNORMAL HIGH (ref 36.0–46.0)
Hemoglobin: 15.8 g/dL — ABNORMAL HIGH (ref 12.0–15.0)
Immature Granulocytes: 0 %
Lymphocytes Relative: 30 %
Lymphs Abs: 1.5 10*3/uL (ref 0.7–4.0)
MCH: 28.4 pg (ref 26.0–34.0)
MCHC: 33 g/dL (ref 30.0–36.0)
MCV: 86 fL (ref 80.0–100.0)
Monocytes Absolute: 0.8 10*3/uL (ref 0.1–1.0)
Monocytes Relative: 16 %
Neutro Abs: 2.6 10*3/uL (ref 1.7–7.7)
Neutrophils Relative %: 53 %
Platelets: 244 10*3/uL (ref 150–400)
RBC: 5.57 MIL/uL — ABNORMAL HIGH (ref 3.87–5.11)
RDW: 15.1 % (ref 11.5–15.5)
WBC: 5 10*3/uL (ref 4.0–10.5)
nRBC: 0 % (ref 0.0–0.2)

## 2023-01-24 LAB — BASIC METABOLIC PANEL
Anion gap: 10 (ref 5–15)
BUN: 23 mg/dL — ABNORMAL HIGH (ref 6–20)
CO2: 18 mmol/L — ABNORMAL LOW (ref 22–32)
Calcium: 8.5 mg/dL — ABNORMAL LOW (ref 8.9–10.3)
Chloride: 107 mmol/L (ref 98–111)
Creatinine, Ser: 1.17 mg/dL — ABNORMAL HIGH (ref 0.44–1.00)
GFR, Estimated: 57 mL/min — ABNORMAL LOW (ref >60)
Glucose, Bld: 98 mg/dL (ref 70–99)
Potassium: 3.9 mmol/L (ref 3.5–5.1)
Sodium: 135 mmol/L (ref 135–145)

## 2023-01-24 NOTE — Plan of Care (Signed)
Patient Alert and Oriented X4, sister at bedside over night. Patient complained of initial headache and chest pain, given Pain med and migraine medication as prescribed. Patient slept off and on throughout night, no addition complaints of headache or chest pain. Problem: Education: Goal: Knowledge of General Education information will improve Description: Including pain rating scale, medication(s)/side effects and non-pharmacologic comfort measures Outcome: Progressing   Problem: Health Behavior/Discharge Planning: Goal: Ability to manage health-related needs will improve Outcome: Progressing   Problem: Clinical Measurements: Goal: Ability to maintain clinical measurements within normal limits will improve Outcome: Progressing Goal: Will remain free from infection Outcome: Progressing Goal: Diagnostic test results will improve Outcome: Progressing Goal: Respiratory complications will improve Outcome: Progressing Goal: Cardiovascular complication will be avoided Outcome: Progressing   Problem: Activity: Goal: Risk for activity intolerance will decrease Outcome: Progressing   Problem: Nutrition: Goal: Adequate nutrition will be maintained Outcome: Progressing   Problem: Coping: Goal: Level of anxiety will decrease Outcome: Progressing   Problem: Elimination: Goal: Will not experience complications related to bowel motility Outcome: Progressing Goal: Will not experience complications related to urinary retention Outcome: Progressing   Problem: Pain Management: Goal: General experience of comfort will improve Outcome: Progressing   Problem: Safety: Goal: Ability to remain free from injury will improve Outcome: Progressing   Problem: Skin Integrity: Goal: Risk for impaired skin integrity will decrease Outcome: Progressing

## 2023-01-24 NOTE — Progress Notes (Signed)
Progress Note   Patient: Brandi Swanson:[id number] DOB: 29-Sep-1975 DOA: 01/21/2023     2 DOS: the patient was seen and examined on 01/24/2023     Brief hospital course: YOSSELYN STUTE is a 47 y.o. female with medical history significant for essential hypertension, tobacco abuse, allergies to ibuprofen, COPD, acid reflux/GERD, depression/anxiety, migraine headaches with aura presenting today with complaints of chest pain and elevated blood pressures.  Chest pain started while patient was getting off of work.  Patient ensures compliance with her medication regimen for high blood pressure.  In the beginning of the chest pain started and her blood pressure was high he started to have also of severe headache along with the chest pain.  Admission requested for hypertensive emergency with chest pain and headaches.     Assessment and Plan: Hypertensive emergency without congestive heart failure Patient transition from stepdown to med/tele Echocardiogram showed ejection fraction of 50% with no diastolic dysfunction Initially patient required Cardizem drip Cardiologist consulted and case discussed   Migraine headaches Headache much better today Continue home medications   Tobacco abuse Counseled on cessation   Chest pain This has been discussed with cardiologist and at this point thought to be due to musculoskeletal related to carrying of heavy items at work EKG unremarkable Troponin slightly elevated secondary to hypotension which is now improved Monitor on telemetry     DVT prophylaxis:  Heparin.   Consults: Cardiology   Advance Care Planning:    Code Status: Full Code    Subjective:  Patient admits to improvement in left-sided chest pain Has some complaint of nausea and vomiting She believes she will be ready by tomorrow for discharge Denies urinary complaints, abdominal pain   Physical Exam:   General: Laying in bed in no acute distress Eyes:     General: Lids  are normal.     Extraocular Movements: Extraocular movements intact.  Cardiovascular:     Rate and Rhythm: Normal rate and regular rhythm.     Heart sounds: Normal heart sounds.  Pulmonary:     Effort: Pulmonary effort is normal.     Breath sounds: Normal breath sounds.  Abdominal:     General: Bowel sounds are normal. There is no distension.     Palpations: Abdomen is soft. There is no mass.     Tenderness: There is no abdominal tenderness.  Musculoskeletal:     Right lower leg: No edema.     Left lower leg: No edema.  Skin:    General: Skin is warm.  Neurological:     General: No focal deficit present.     Mental Status: She is alert and oriented to person, place, and time.     Cranial Nerves: Cranial nerves 2-12 are intact.  Psychiatric:    Normal   Family Communication: No family at bedside at this time   Disposition: Status is: Inpatient   Time spent: 41 minutes     Data Reviewed:     Latest Ref Rng & Units 01/24/2023    5:02 AM 01/23/2023    4:46 AM 01/22/2023    4:10 AM  BMP  Glucose 70 - 99 mg/dL 98  811  914   BUN 6 - 20 mg/dL 23  19  12    Creatinine 0.44 - 1.00 mg/dL 7.82  9.56  2.13   Sodium 135 - 145 mmol/L 135  135  135   Potassium 3.5 - 5.1 mmol/L 3.9  3.4  3.5   Chloride  98 - 111 mmol/L 107  107  107   CO2 22 - 32 mmol/L 18  17  20    Calcium 8.9 - 10.3 mg/dL 8.5  8.5  8.4     Vitals:   01/23/23 1954 01/24/23 0456 01/24/23 0836 01/24/23 1652  BP: (!) 146/83 (!) 135/94 (!) 158/85 (!) 160/84  Pulse: (!) 114 (!) 108 (!) 107 97  Resp: 20 20    Temp: 98.6 F (37 C) 97.9 F (36.6 C) (!) 97.4 F (36.3 C) 98.1 F (36.7 C)  TempSrc: Oral Oral Oral Oral  SpO2: 100% 98% 99% 100%  Weight:      Height:          Latest Ref Rng & Units 01/24/2023    5:02 AM 01/23/2023    4:46 AM 01/22/2023    4:10 AM  CBC  WBC 4.0 - 10.5 K/uL 5.0  7.4  4.5   Hemoglobin 12.0 - 15.0 g/dL 82.9  56.2  13.0   Hematocrit 36.0 - 46.0 % 47.9  45.1  43.4   Platelets 150 - 400  K/uL 244  227  213      Author: Loyce Dys, MD 01/24/2023 5:39 PM  For on call review [email or web address].

## 2023-01-24 NOTE — TOC CM/SW Note (Signed)
Transition of Care [facility]) - Inpatient Brief Assessment   Patient Details  Name: Brandi Swanson MRN: [id number] Date of Birth: May 01, 1975  [facility]) CM/SW Contact:    Liliana Cline, LCSW Phone Number: 01/24/2023, 9:08 AM   Transition of Care Asessment: Insurance and Status: Insurance coverage has been reviewed Patient has primary care physician: Yes     Prior/Current Home Services: No current home services Social Determinants of Health Reivew: SDOH reviewed no interventions necessary Readmission risk has been reviewed: Yes Transition of care needs: no transition of care needs at this time

## 2023-01-25 DIAGNOSIS — I161 Hypertensive emergency: Secondary | ICD-10-CM | POA: Diagnosis not present

## 2023-01-25 LAB — CBC WITH DIFFERENTIAL/PLATELET
Abs Immature Granulocytes: 0.01 10*3/uL (ref 0.00–0.07)
Basophils Absolute: 0 10*3/uL (ref 0.0–0.1)
Basophils Relative: 1 %
Eosinophils Absolute: 0.1 10*3/uL (ref 0.0–0.5)
Eosinophils Relative: 3 %
HCT: 46.7 % — ABNORMAL HIGH (ref 36.0–46.0)
Hemoglobin: 15.3 g/dL — ABNORMAL HIGH (ref 12.0–15.0)
Immature Granulocytes: 0 %
Lymphocytes Relative: 31 %
Lymphs Abs: 1.3 10*3/uL (ref 0.7–4.0)
MCH: 28 pg (ref 26.0–34.0)
MCHC: 32.8 g/dL (ref 30.0–36.0)
MCV: 85.5 fL (ref 80.0–100.0)
Monocytes Absolute: 0.7 10*3/uL (ref 0.1–1.0)
Monocytes Relative: 18 %
Neutro Abs: 1.9 10*3/uL (ref 1.7–7.7)
Neutrophils Relative %: 47 %
Platelets: 237 10*3/uL (ref 150–400)
RBC: 5.46 MIL/uL — ABNORMAL HIGH (ref 3.87–5.11)
RDW: 15.3 % (ref 11.5–15.5)
WBC: 4.1 10*3/uL (ref 4.0–10.5)
nRBC: 0 % (ref 0.0–0.2)

## 2023-01-25 LAB — BASIC METABOLIC PANEL
Anion gap: 8 (ref 5–15)
BUN: 20 mg/dL (ref 6–20)
CO2: 18 mmol/L — ABNORMAL LOW (ref 22–32)
Calcium: 8.4 mg/dL — ABNORMAL LOW (ref 8.9–10.3)
Chloride: 110 mmol/L (ref 98–111)
Creatinine, Ser: 0.88 mg/dL (ref 0.44–1.00)
GFR, Estimated: >60 mL/min (ref >60)
Glucose, Bld: 110 mg/dL — ABNORMAL HIGH (ref 70–99)
Potassium: 3.9 mmol/L (ref 3.5–5.1)
Sodium: 136 mmol/L (ref 135–145)

## 2023-01-25 MED ORDER — CYCLOBENZAPRINE HCL 7.5 MG PO TABS: 7.5000 mg | ORAL_TABLET | Freq: Three times a day (TID) | ORAL | 0 refills | Status: AC | PRN

## 2023-01-25 MED ORDER — HYDRALAZINE HCL 50 MG PO TABS: 50.0000 mg | ORAL_TABLET | Freq: Three times a day (TID) | ORAL | 0 refills | Status: AC

## 2023-01-25 MED ORDER — PAROXETINE HCL 40 MG PO TABS: 40.0000 mg | ORAL_TABLET | Freq: Every day | ORAL | 0 refills | Status: AC

## 2023-01-25 MED ORDER — NICOTINE 21 MG/24HR TD PT24: 21.0000 mg | MEDICATED_PATCH | Freq: Every day | TRANSDERMAL | 0 refills | Status: AC

## 2023-01-25 MED ORDER — HYDROCODONE-ACETAMINOPHEN 5-325 MG PO TABS: 1.0000 | ORAL_TABLET | ORAL | 0 refills | Status: AC | PRN

## 2023-01-25 MED ORDER — VALSARTAN 160 MG PO TABS: 160.0000 mg | ORAL_TABLET | Freq: Every day | ORAL | 11 refills | Status: AC

## 2023-01-25 NOTE — Discharge Summary (Signed)
Physician Discharge Summary   Patient: Brandi Swanson MRN: [id number] DOB: 1976/02/09  Admit date:     01/21/2023  Discharge date: 01/25/23  Discharge Physician: Loyce Dys   PCP: Hillery Aldo, MD   Recommendations at discharge:   Discharge Diagnoses: Hypertensive emergency without congestive heart failure Laking and hypertension Migraine headaches Headache much better today Tobacco abuse Chest pain secondary to musculoskeletal/costochondritis  Hospital Course: Brandi Swanson is a 47 y.o. female with medical history significant for essential hypertension, tobacco abuse, allergies to ibuprofen, COPD, acid reflux/GERD, depression/anxiety, migraine headaches with aura presenting today with complaints of chest pain and elevated blood pressures.  Chest pain started while patient was getting off of work.  Patient ensures compliance with her medication regimen for high blood pressure.  In the beginning of the chest pain started and her blood pressure was high he started to have also of severe headache along with the chest pain.  Admission requested for hypertensive emergency with chest pain and headaches.  Patient had flat troponins EKG did not show any concerns of ischemia, patient was seen by cardiologist and echocardiogram was normal.  Patient had tenderness with palpation and as such her pain thought to be due to musculoskeletal related to lifting heavy load at work.  Pain is improved and therefore patient being discharged today to follow-up with primary care physician and to have some work free interval.   Consultants: Cardiology Procedures performed: None Disposition: Home Diet recommendation:  Cardiac diet DISCHARGE MEDICATION: Allergies as of 01/25/2023       Reactions   Lisinopril Swelling   Ibuprofen Swelling        Medication List     STOP taking these medications    cetirizine 10 MG tablet Commonly known as: ZYRTEC   hydrOXYzine 25 MG capsule Commonly  known as: Vistaril       TAKE these medications    amitriptyline 50 MG tablet Commonly known as: ELAVIL Take 1 tablet (50 mg total) by mouth at bedtime.   amLODipine 10 MG tablet Commonly known as: NORVASC Take 1 tablet (10 mg total) by mouth daily.   aspirin-acetaminophen-caffeine 250-250-65 MG tablet Commonly known as: EXCEDRIN MIGRAINE Take 1 tablet by mouth every 6 (six) hours as needed for headache.   celecoxib 100 MG capsule Commonly known as: CELEBREX Take 100 mg by mouth 2 (two) times daily.   cyclobenzaprine 7.5 MG tablet Commonly known as: FEXMID Take 1 tablet (7.5 mg total) by mouth 3 (three) times daily as needed for muscle spasms (left chest rib pain).   hydrALAZINE 50 MG tablet Commonly known as: APRESOLINE Take 1 tablet (50 mg total) by mouth every 8 (eight) hours.   hydrochlorothiazide 50 MG tablet Commonly known as: HYDRODIURIL Take 50 mg by mouth daily. What changed: Another medication with the same name was removed. Continue taking this medication, and follow the directions you see here.   HYDROcodone-acetaminophen 5-325 MG tablet Commonly known as: NORCO/VICODIN Take 1 tablet by mouth every 4 (four) hours as needed for up to 3 days for moderate pain (pain score 4-6).   hydrOXYzine 25 MG tablet Commonly known as: ATARAX Take 25 mg by mouth every 6 (six) hours as needed for anxiety or itching.   nicotine 21 mg/24hr patch Commonly known as: NICODERM CQ - dosed in mg/24 hours Place 1 patch (21 mg total) onto the skin daily. Start taking on: January 26, 2023   omeprazole 40 MG capsule Commonly known as: PRILOSEC Take 40 mg by  mouth daily. What changed: Another medication with the same name was removed. Continue taking this medication, and follow the directions you see here.   ondansetron 4 MG disintegrating tablet Commonly known as: ZOFRAN-ODT Take 1 tablet (4 mg total) by mouth every 8 (eight) hours as needed.   PARoxetine 40 MG  tablet Commonly known as: PAXIL Take 1 tablet (40 mg total) by mouth daily. Start taking on: January 26, 2023   propranolol 80 MG tablet Commonly known as: INDERAL Take 80 mg by mouth 2 (two) times daily. What changed: Another medication with the same name was removed. Continue taking this medication, and follow the directions you see here.   SUMAtriptan 50 MG tablet Commonly known as: IMITREX Take 50 mg by mouth as directed.  TAKE 1 TABLET BY MOUTH NOW, MAY REPEAT IN 2 HOURS. NO MORE THAN 4 TABLETS (200MG )PER DAY.   valsartan 160 MG tablet Commonly known as: Diovan Take 1 tablet (160 mg total) by mouth daily.   Ventolin HFA 108 (90 Base) MCG/ACT inhaler Generic drug: albuterol INHALE 2 PUFFS INTO THE LUNGS EVERY 6 HOURS AS NEEDED FOR WHEEZING ORSHORTNESS OF BREATH.        Discharge Exam: Filed Weights   01/21/23 1648 01/25/23 0346  Weight: 89.4 kg 88.5 kg    General: Lids are normal.     Extraocular Movements: Extraocular movements intact.  Cardiovascular:     Rate and Rhythm: Normal rate and regular rhythm.     Heart sounds: Normal heart sounds.  Pulmonary:     Effort: Pulmonary effort is normal.     Breath sounds: Normal breath sounds.  Abdominal:     General: Bowel sounds are normal. There is no distension.     Palpations: Abdomen is soft. There is no mass.     Tenderness: There is no abdominal tenderness.  Musculoskeletal:     Right lower leg: No edema.     Left lower leg: No edema.  Skin:    General: Skin is warm.  Neurological:     General: No focal deficit present.     Mental Status: She is alert and oriented to person, place, and time.     Cranial Nerves: Cranial nerves 2-12 are intact.  Psychiatric:    Normal    Condition at discharge: good  The results of significant diagnostics from this hospitalization (including imaging, microbiology, ancillary and laboratory) are listed below for reference.   Imaging Studies: ECHOCARDIOGRAM  COMPLETE  Result Date: 01/22/2023    ECHOCARDIOGRAM REPORT   Patient Name:   Brandi Swanson Date of Exam: 01/22/2023 Medical Rec #:  [id number]          Height:       65.0 in Accession #:    [id number]         Weight:       197.0 lb Date of Birth:  1975/06/08          BSA:          1.965 m Patient Age:    47 years           BP:           182/89 mmHg Patient Gender: F                  HR:           78 bpm. Exam Location:  [facility] Procedure: 2D Echo, Cardiac Doppler and Color Doppler Indications:     Chest Pain  History:         Patient has no prior history of Echocardiogram examinations.                  COPD, Signs/Symptoms:Chest Pain; Risk Factors:Hypertension and                  Current Smoker.  Sonographer:     Mikki Harbor Referring Phys:  [id number] EKTA V PATEL Diagnosing Phys: Rozell Searing Custovic IMPRESSIONS  1. Left ventricular ejection fraction, by estimation, is 50%. The left ventricle has low normal function. The left ventricle has no regional wall motion abnormalities. Left ventricular diastolic parameters were normal.  2. Right ventricular systolic function is normal. The right ventricular size is normal.  3. The mitral valve is normal in structure. No evidence of mitral valve regurgitation. No evidence of mitral stenosis.  4. The aortic valve is normal in structure. Aortic valve regurgitation is not visualized. No aortic stenosis is present. FINDINGS  Left Ventricle: Left ventricular ejection fraction, by estimation, is 50%. The left ventricle has low normal function. The left ventricle has no regional wall motion abnormalities. The left ventricular internal cavity size was normal in size. There is no left ventricular hypertrophy. Left ventricular diastolic parameters were normal. Right Ventricle: The right ventricular size is normal. No increase in right ventricular wall thickness. Right ventricular systolic function is normal. Left Atrium: Left atrial size was normal in size. Right Atrium: Right atrial  size was normal in size. Pericardium: There is no evidence of pericardial effusion. Mitral Valve: The mitral valve is normal in structure. No evidence of mitral valve regurgitation. No evidence of mitral valve stenosis. MV peak gradient, 3.0 mmHg. The mean mitral valve gradient is 1.0 mmHg. Tricuspid Valve: The tricuspid valve is normal in structure. Tricuspid valve regurgitation is not demonstrated. No evidence of tricuspid stenosis. Aortic Valve: The aortic valve is normal in structure. Aortic valve regurgitation is not visualized. No aortic stenosis is present. Aortic valve mean gradient measures 4.3 mmHg. Aortic valve peak gradient measures 8.8 mmHg. Aortic valve area, by VTI measures 2.85 cm. Pulmonic Valve: The pulmonic valve was normal in structure. Pulmonic valve regurgitation is not visualized. No evidence of pulmonic stenosis. Aorta: The aortic root is normal in size and structure. IAS/Shunts: No atrial level shunt detected by color flow Doppler.  LEFT VENTRICLE PLAX 2D LVIDd:         4.40 cm     Diastology LVIDs:         3.50 cm     LV e' medial:    4.90 cm/s LV PW:         1.50 cm     LV E/e' medial:  10.4 LV IVS:        1.20 cm     LV e' lateral:   7.07 cm/s LVOT diam:     2.40 cm     LV E/e' lateral: 7.2 LV SV:         84 LV SV Index:   43 LVOT Area:     4.52 cm  LV Volumes (MOD) LV vol d, MOD A2C: 49.0 ml LV vol d, MOD A4C: 85.0 ml LV vol s, MOD A2C: 28.3 ml LV vol s, MOD A4C: 36.2 ml LV SV MOD A2C:     20.7 ml LV SV MOD A4C:     85.0 ml LV SV MOD BP:      36.0 ml RIGHT VENTRICLE RV Basal diam:  3.10 cm RV Mid  diam:    2.40 cm RV S prime:     16.60 cm/s TAPSE (M-mode): 2.5 cm LEFT ATRIUM             Index        RIGHT ATRIUM           Index LA diam:        4.30 cm 2.19 cm/m   RA Area:     14.80 cm LA Vol (A2C):   64.9 ml 33.02 ml/m  RA Volume:   33.40 ml  16.99 ml/m LA Vol (A4C):   55.8 ml 28.39 ml/m LA Biplane Vol: 60.7 ml 30.88 ml/m  AORTIC VALVE                    PULMONIC VALVE AV Area  (Vmax):    3.02 cm     PV Vmax:       1.45 m/s AV Area (Vmean):   2.91 cm     PV Peak grad:  8.4 mmHg AV Area (VTI):     2.85 cm AV Vmax:           148.67 cm/s AV Vmean:          92.200 cm/s AV VTI:            0.295 m AV Peak Grad:      8.8 mmHg AV Mean Grad:      4.3 mmHg LVOT Vmax:         99.10 cm/s LVOT Vmean:        59.400 cm/s LVOT VTI:          0.186 m LVOT/AV VTI ratio: 0.63  AORTA Ao Root diam: 2.80 cm MITRAL VALVE MV Area (PHT): 4.31 cm    SHUNTS MV Area VTI:   5.16 cm    Systemic VTI:  0.19 m MV Peak grad:  3.0 mmHg    Systemic Diam: 2.40 cm MV Mean grad:  1.0 mmHg MV Vmax:       0.86 m/s MV Vmean:      49.9 cm/s MV Decel Time: 176 msec MV E velocity: 51.10 cm/s MV A velocity: 79.40 cm/s MV E/A ratio:  0.64 Sabina Custovic Electronically signed by Clotilde Dieter Signature Date/Time: 01/22/2023/1:12:26 PM    Final    CT Angio Chest/Abd/Pel for Dissection W and/or Wo Contrast  Result Date: 01/21/2023 CLINICAL DATA:  Acute aortic syndrome (AAS) suspected. Chest pain. Elevated blood pressure. EXAM: CT ANGIOGRAPHY CHEST, ABDOMEN AND PELVIS TECHNIQUE: Non-contrast CT of the chest was initially obtained. Multidetector CT imaging through the chest, abdomen and pelvis was performed using the standard protocol during bolus administration of intravenous contrast. Multiplanar reconstructed images and MIPs were obtained and reviewed to evaluate the vascular anatomy. RADIATION DOSE REDUCTION: This exam was performed according to the departmental dose-optimization program which includes automated exposure control, adjustment of the mA and/or kV according to patient size and/or use of iterative reconstruction technique. CONTRAST:  OMNIPAQUE IOHEXOL 350 MG/ML SOLN COMPARISON:  None Available. FINDINGS: CTA CHEST FINDINGS Cardiovascular: No intramural hematoma noted in the thoracic aorta on the unenhanced images. Thoracic aorta is normal in caliber without aneurysm, dissection, vasculitis or significant  stenosis. Normal cardiac size. No pericardial effusion. No aortic aneurysm. Mediastinum/Nodes: Visualized thyroid gland appears grossly unremarkable. No solid / cystic mediastinal masses. The esophagus is nondistended precluding optimal assessment. No axillary, mediastinal or hilar lymphadenopathy by size criteria. Lungs/Pleura: The central tracheo-bronchial tree is patent. There is mild, smooth, circumferential  thickening of the segmental and subsegmental bronchial walls, throughout bilateral lungs, which is nonspecific. Findings are most commonly seen with bronchitis or reactive airway disease, such as asthma. No mass or consolidation. No pleural effusion or pneumothorax. No suspicious lung nodules. Musculoskeletal: The visualized soft tissues of the chest wall are grossly unremarkable. No suspicious osseous lesions. There are mild multilevel degenerative changes in the visualized spine. Review of the MIP images confirms the above findings. CTA ABDOMEN AND PELVIS FINDINGS VASCULAR Aorta: Normal caliber aorta without aneurysm, dissection, vasculitis or significant stenosis. Celiac: Patent without evidence of aneurysm, dissection, vasculitis or significant stenosis. SMA: Patent without evidence of aneurysm, dissection, vasculitis or significant stenosis. Renals: Both renal arteries are patent without evidence of aneurysm, dissection, vasculitis, fibromuscular dysplasia or significant stenosis. IMA: Patent without evidence of aneurysm, dissection, vasculitis or significant stenosis. Inflow: Patent without evidence of aneurysm, dissection, vasculitis or significant stenosis. Veins: No obvious venous abnormality within the limitations of this arterial phase study. Review of the MIP images confirms the above findings. NON-VASCULAR Hepatobiliary: The liver is normal in size. Non-cirrhotic configuration. No suspicious mass. No intrahepatic or extrahepatic bile duct dilation. Gallbladder is surgically absent. Pancreas:  Unremarkable. No pancreatic ductal dilatation or surrounding inflammatory changes. Spleen: Within normal limits. No focal lesion. Adrenals/Urinary Tract: Adrenal glands are unremarkable. No suspicious renal mass. No hydronephrosis. No renal or ureteric calculi. Urinary bladder is under distended, precluding optimal assessment. However, no large mass or stones identified. No perivesical fat stranding. Stomach/Bowel: No disproportionate dilation of the small or large bowel loops. No evidence of abnormal bowel wall thickening or inflammatory changes. The appendix is unremarkable. There are scattered diverticula mainly in the left hemi colon, without imaging signs of diverticulitis. Vascular/Lymphatic: No ascites or pneumoperitoneum. No abdominal or pelvic lymphadenopathy, by size criteria. No aneurysmal dilation of the major abdominal arteries. Reproductive: The uterus is unremarkable. No large adnexal mass. Other: There is a small fat containing umbilical hernia. The soft tissues and abdominal wall are otherwise unremarkable. Musculoskeletal: No suspicious osseous lesions. There are mild multilevel degenerative changes in the visualized spine. Review of the MIP images confirms the above findings. IMPRESSION: *No acute aortic syndrome. No intramural hematoma in the thoracic aorta. No aneurysm, dissection or penetrating atherosclerotic ulcer of the aorta. *Multiple other nonacute observations, as described above. Electronically Signed   By: Jules Schick M.D.   On: 01/21/2023 18:40   CT HEAD WO CONTRAST ( )  Result Date: 01/21/2023 CLINICAL DATA:  Sudden headache. EXAM: CT HEAD WITHOUT CONTRAST TECHNIQUE: Contiguous axial images were obtained from the base of the skull through the vertex without intravenous contrast. RADIATION DOSE REDUCTION: This exam was performed according to the departmental dose-optimization program which includes automated exposure control, adjustment of the mA and/or kV according to  patient size and/or use of iterative reconstruction technique. COMPARISON:  CT scan head from 09/19/2022. FINDINGS: Brain: No evidence of acute infarction, hemorrhage, hydrocephalus, extra-axial collection or mass lesion/mass effect. Ventricles are normal. Cerebral volume is age appropriate. Vascular: No hyperdense vessel or unexpected calcification. Skull: Normal. Negative for fracture. Redemonstration of focal expansile remodeling of the left anterior temporal bone, similar to prior studies. Sinuses/Orbits: No acute intracranial abnormality. Other: Visualized mastoid air cells are unremarkable. No mastoid effusion. IMPRESSION: *No acute intracranial abnormality.  No subarachnoid hemorrhage. Electronically Signed   By: Jules Schick M.D.   On: 01/21/2023 18:31   DG Chest 2 View  Result Date: 01/21/2023 CLINICAL DATA:  Chest pain. EXAM: CHEST - 2 VIEW COMPARISON:  02/10/2002. FINDINGS: Bilateral lung fields are clear. Bilateral costophrenic angles are clear. Normal cardio-mediastinal silhouette. No acute osseous abnormalities. The soft tissues are within normal limits. IMPRESSION: No active cardiopulmonary disease. Electronically Signed   By: Jules Schick M.D.   On: 01/21/2023 18:26    Microbiology: Results for orders placed or performed during the hospital encounter of 02/10/22  Resp panel by RT-PCR (RSV, Flu A&B, Covid) Anterior Nasal Swab     Status: Abnormal   Collection Time: 02/10/22  9:47 AM   Specimen: Anterior Nasal Swab  Result Value Ref Range Status   SARS Coronavirus 2 by RT PCR NEGATIVE NEGATIVE Final    Comment: (NOTE) SARS-CoV-2 target nucleic acids are NOT DETECTED.  The SARS-CoV-2 RNA is generally detectable in upper respiratory specimens during the acute phase of infection. The lowest concentration of SARS-CoV-2 viral copies this assay can detect is 138 copies/mL. A negative result does not preclude SARS-Cov-2 infection and should not be used as the sole basis for treatment  or other patient management decisions. A negative result may occur with  improper specimen collection/handling, submission of specimen other than nasopharyngeal swab, presence of viral mutation(s) within the areas targeted by this assay, and inadequate number of viral copies(<138 copies/mL). A negative result must be combined with clinical observations, patient history, and epidemiological information. The expected result is Negative.  Fact Sheet for Patients:  BloggerCourse.com  Fact Sheet for Healthcare Providers:  SeriousBroker.it  This test is no t yet approved or cleared by the [place] FDA and  has been authorized for detection and/or diagnosis of SARS-CoV-2 by FDA under an Emergency Use Authorization (EUA). This EUA will remain  in effect (meaning this test can be used) for the duration of the COVID-19 declaration under Section 564(b)(1) of the Act, 21 U.S.C.section 360bbb-3(b)(1), unless the authorization is terminated  or revoked sooner.       Influenza A by PCR POSITIVE (A) NEGATIVE Final   Influenza B by PCR NEGATIVE NEGATIVE Final    Comment: (NOTE) The Xpert Xpress SARS-CoV-2/FLU/RSV plus assay is intended as an aid in the diagnosis of influenza from Nasopharyngeal swab specimens and should not be used as a sole basis for treatment. Nasal washings and aspirates are unacceptable for Xpert Xpress SARS-CoV-2/FLU/RSV testing.  Fact Sheet for Patients: BloggerCourse.com  Fact Sheet for Healthcare Providers: SeriousBroker.it  This test is not yet approved or cleared by the [place] FDA and has been authorized for detection and/or diagnosis of SARS-CoV-2 by FDA under an Emergency Use Authorization (EUA). This EUA will remain in effect (meaning this test can be used) for the duration of the COVID-19 declaration under Section 564(b)(1) of the Act, 21  U.S.C. section 360bbb-3(b)(1), unless the authorization is terminated or revoked.     Resp Syncytial Virus by PCR NEGATIVE NEGATIVE Final    Comment: (NOTE) Fact Sheet for Patients: BloggerCourse.com  Fact Sheet for Healthcare Providers: SeriousBroker.it  This test is not yet approved or cleared by the [place] FDA and has been authorized for detection and/or diagnosis of SARS-CoV-2 by FDA under an Emergency Use Authorization (EUA). This EUA will remain in effect (meaning this test can be used) for the duration of the COVID-19 declaration under Section 564(b)(1) of the Act, 21 U.S.C. section 360bbb-3(b)(1), unless the authorization is terminated or revoked.  Performed at [facility], [address] [place], [place] [address]     Labs: CBC: Recent Labs  Lab 01/21/23 1650 01/22/23 0410 01/23/23 0446 01/24/23 0502 01/25/23  0604  WBC 6.1 4.5 7.4 5.0 4.1  NEUTROABS  --   --  5.3 2.6 1.9  HGB 15.0 14.2 15.2* 15.8* 15.3*  HCT 46.0 43.4 45.1 47.9* 46.7*  MCV 86.8 85.4 85.1 86.0 85.5  PLT 231 213 227 244 237   Basic Metabolic Panel: Recent Labs  Lab 01/21/23 1650 01/22/23 0410 01/23/23 0446 01/23/23 0614 01/24/23 0502 01/25/23 0604  NA 140 135 135  --  135 136  K 3.7 3.5 3.4*  --  3.9 3.9  CL 111 107 107  --  107 110  CO2 20* 20* 17*  --  18* 18*  GLUCOSE 99 102* 105*  --  98 110*  BUN 17 12 19   --  23* 20  CREATININE 0.86 0.72 0.99  --  1.17* 0.88  CALCIUM 8.9 8.4* 8.5*  --  8.5* 8.4*  MG  --   --   --  2.2  --   --    Liver Function Tests: Recent Labs  Lab 01/22/23 0410  AST 16  ALT 15  ALKPHOS 47  BILITOT 0.9  PROT 7.4  ALBUMIN 4.2   CBG: No results for input(s): "GLUCAP" in the last 168 hours.  Discharge time spent:  35 minutes.  Signed: Loyce Dys, MD [facility] 01/25/2023

## 2023-03-05 ENCOUNTER — Emergency Department
Admission: EM | Admit: 2023-03-05 | Discharge: 2023-03-05 | Disposition: A | Discharge status: Home / Self Care | Payer: 59 | Attending: Student in an Organized Health Care Education/Training Program | Admitting: Student in an Organized Health Care Education/Training Program

## 2023-03-05 ENCOUNTER — Emergency Department: Payer: 59

## 2023-03-05 ENCOUNTER — Other Ambulatory Visit: Payer: Self-pay

## 2023-03-05 DIAGNOSIS — R059 Cough, unspecified: Secondary | ICD-10-CM | POA: Diagnosis present

## 2023-03-05 DIAGNOSIS — I10 Essential (primary) hypertension: Secondary | ICD-10-CM | POA: Diagnosis not present

## 2023-03-05 DIAGNOSIS — Z20822 Contact with and (suspected) exposure to covid-19: Secondary | ICD-10-CM | POA: Insufficient documentation

## 2023-03-05 DIAGNOSIS — J205 Acute bronchitis due to respiratory syncytial virus: Secondary | ICD-10-CM | POA: Insufficient documentation

## 2023-03-05 DIAGNOSIS — J40 Bronchitis, not specified as acute or chronic: Secondary | ICD-10-CM

## 2023-03-05 LAB — CBC
HCT: 51 % — ABNORMAL HIGH (ref 36.0–46.0)
Hemoglobin: 16.4 g/dL — ABNORMAL HIGH (ref 12.0–15.0)
MCH: 28 pg (ref 26.0–34.0)
MCHC: 32.2 g/dL (ref 30.0–36.0)
MCV: 87 fL (ref 80.0–100.0)
Platelets: 242 10*3/uL (ref 150–400)
RBC: 5.86 MIL/uL — ABNORMAL HIGH (ref 3.87–5.11)
RDW: 14.6 % (ref 11.5–15.5)
WBC: 4.2 10*3/uL (ref 4.0–10.5)
nRBC: 0 % (ref 0.0–0.2)

## 2023-03-05 LAB — COMPREHENSIVE METABOLIC PANEL
ALT: 21 U/L (ref 0–44)
AST: 22 U/L (ref 15–41)
Albumin: 5.2 g/dL — ABNORMAL HIGH (ref 3.5–5.0)
Alkaline Phosphatase: 67 U/L (ref 38–126)
Anion gap: 11 (ref 5–15)
BUN: 13 mg/dL (ref 6–20)
CO2: 20 mmol/L — ABNORMAL LOW (ref 22–32)
Calcium: 9.1 mg/dL (ref 8.9–10.3)
Chloride: 107 mmol/L (ref 98–111)
Creatinine, Ser: 0.88 mg/dL (ref 0.44–1.00)
GFR, Estimated: >60 mL/min (ref >60)
Glucose, Bld: 99 mg/dL (ref 70–99)
Potassium: 3.7 mmol/L (ref 3.5–5.1)
Sodium: 138 mmol/L (ref 135–145)
Total Bilirubin: 0.6 mg/dL (ref 0.0–1.2)
Total Protein: 8.9 g/dL — ABNORMAL HIGH (ref 6.5–8.1)

## 2023-03-05 LAB — RESP PANEL BY RT-PCR (RSV, FLU A&B, COVID)  RVPGX2
Influenza A by PCR: NEGATIVE
Influenza B by PCR: NEGATIVE
Resp Syncytial Virus by PCR: POSITIVE — AB
SARS Coronavirus 2 by RT PCR: NEGATIVE

## 2023-03-05 MED ORDER — PREDNISONE 20 MG PO TABS
60.0000 mg | ORAL_TABLET | Freq: Once | ORAL | Status: AC
  Administered 2023-03-05: 60 mg via ORAL
  Filled 2023-03-05: qty 3

## 2023-03-05 MED ORDER — IPRATROPIUM-ALBUTEROL 0.5-2.5 (3) MG/3ML IN SOLN
3.0000 mL | Freq: Once | RESPIRATORY_TRACT | Status: AC
  Administered 2023-03-05: 3 mL via RESPIRATORY_TRACT
  Filled 2023-03-05: qty 3

## 2023-03-05 MED ORDER — ALBUTEROL SULFATE HFA 108 (90 BASE) MCG/ACT IN AERS: 2.0000 | INHALATION_SPRAY | Freq: Four times a day (QID) | RESPIRATORY_TRACT | 0 refills | Status: AC | PRN

## 2023-03-05 MED ORDER — HYDRALAZINE HCL 50 MG PO TABS
50.0000 mg | ORAL_TABLET | Freq: Once | ORAL | Status: AC
  Administered 2023-03-05: 50 mg via ORAL
  Filled 2023-03-05: qty 1

## 2023-03-05 MED ORDER — AMLODIPINE BESYLATE 5 MG PO TABS: 10.0000 mg | ORAL_TABLET | Freq: Once | ORAL | Status: DC

## 2023-03-05 MED ORDER — ACETAMINOPHEN 325 MG PO TABS
650.0000 mg | ORAL_TABLET | Freq: Once | ORAL | Status: AC
  Administered 2023-03-05: 650 mg via ORAL
  Filled 2023-03-05: qty 2

## 2023-03-05 MED ORDER — PREDNISONE 20 MG PO TABS: 40.0000 mg | ORAL_TABLET | Freq: Every day | ORAL | 0 refills | Status: AC

## 2023-03-05 NOTE — ED Triage Notes (Signed)
Pt here with a cough. Pt sent by her primary with possible pna. Pt endorses chills but denies fever. Pt endorses pain from coughing.

## 2023-03-05 NOTE — ED Notes (Signed)
See triage notes. Patient c/o a cough and was sent by her PCP for possible pneumonia.

## 2023-03-05 NOTE — ED Provider Notes (Signed)
[facility] Provider Note    Event Date/Time   First MD Initiated Contact with Patient 03/05/23 1329     (approximate)   History   Cough   HPI  Brandi Swanson is a 48 y.o. female history of hypertension presents to the ER for evaluation of 24 hours of cough malaise chills.  Has had productive cough since yesterday coughing up brown phlegm.  No recent antibiotics.  Does not endorse any history of lung issues.  States that she been compliant with her medications.     Physical Exam   Triage Vital Signs: ED Triage Vitals  Encounter Vitals Group     BP 03/05/23 1215 (!) 196/122     Systolic BP Percentile --      Diastolic BP Percentile --      Pulse Rate 03/05/23 1212 (!) 104     Resp 03/05/23 1212 18     Temp 03/05/23 1212 98.7 F (37.1 C)     Temp Source 03/05/23 1211 Oral     SpO2 03/05/23 1212 99 %     Weight 03/05/23 1212 195 lb 1.7 oz (88.5 kg)     Height 03/05/23 1212 5\' 5"  (1.651 m)     Head Circumference --      Peak Flow --      Pain Score 03/05/23 1213 8     Pain Loc --      Pain Education --      Exclude from Growth Chart --     Most recent vital signs: Vitals:   03/05/23 1212 03/05/23 1215  BP:  (!) 196/122  Pulse: (!) 104   Resp: 18   Temp: 98.7 F (37.1 C)   SpO2: 99%      Constitutional: Alert  Eyes: Conjunctivae are normal.  Head: Atraumatic. Nose: No congestion/rhinnorhea. Mouth/Throat: Mucous membranes are moist.   Neck: Painless ROM.  Cardiovascular:   Good peripheral circulation. Respiratory: Diffuse coarse wheeze in all lung fields scattered rhonchi. Gastrointestinal: Soft and nontender.  Musculoskeletal:  no deformity Neurologic:  MAE spontaneously. No gross focal neurologic deficits are appreciated.  Skin:  Skin is warm, dry and intact. No rash noted. Psychiatric: Mood and affect are normal. Speech and behavior are normal.    ED Results / Procedures / Treatments   Labs (all labs ordered are  listed, but only abnormal results are displayed) Labs Reviewed  RESP PANEL BY RT-PCR (RSV, FLU A&B, COVID)  RVPGX2 - Abnormal; Notable for the following components:      Result Value   Resp Syncytial Virus by PCR POSITIVE (*)    All other components within normal limits  CBC - Abnormal; Notable for the following components:   RBC 5.86 (*)    Hemoglobin 16.4 (*)    HCT 51.0 (*)    All other components within normal limits  COMPREHENSIVE METABOLIC PANEL - Abnormal; Notable for the following components:   CO2 20 (*)    Total Protein 8.9 (*)    Albumin 5.2 (*)    All other components within normal limits     EKG     RADIOLOGY Please see ED Course for my review and interpretation.  I personally reviewed all radiographic images ordered to evaluate for the above acute complaints and reviewed radiology reports and findings.  These findings were personally discussed with the patient.  Please see medical record for radiology report.    PROCEDURES:  Critical Care performed: No  Procedures   MEDICATIONS  ORDERED IN ED: Medications  ipratropium-albuterol (DUONEB) 0.5-2.5 (3) MG/3ML nebulizer solution 3 mL (has no administration in time range)     IMPRESSION / MDM / ASSESSMENT AND PLAN / ED COURSE  I reviewed the triage vital signs and the nursing notes.                              Differential diagnosis includes, but is not limited to, pneumonia, bronchitis, COPD, PE, CHF, viral illness  Patient presenting to the ER for evaluation of symptoms as described above.  Based on symptoms, risk factors and considered above differential, this presenting complaint could reflect a potentially life-threatening illness therefore the patient will be placed on continuous pulse oximetry and telemetry for monitoring.  Laboratory evaluation will be sent to evaluate for the above complaints.      Clinical Course as of 03/05/23 1445  Fri Mar 05, 2023  1416 Patient able to ambulate down the  hall without any significant dyspnea or hypoxia.  She did feel significant improvement after nebulizer treatment therefore we will order second 1 as well as steroid as I do suspect she has bronchitis secondary to RSV. [PR]  1432 Patient noted to be still little bit hypertensive.  States that she is due for her midday hydralazine which has been ordered.  She is denying any chest pain.  Does not seem consistent with acute CHF.  Presentation is consistent with bronchitis.  Not consistent with PE.  She does appear stable appropriate for outpatient follow-up.  We discussed signs and symptoms for which she should return to the ER. [PR]    Clinical Course User Index [PR] Willy Eddy, MD     FINAL CLINICAL IMPRESSION(S) / ED DIAGNOSES   Final diagnoses:  Bronchitis  RSV bronchitis     Rx / DC Orders   ED Discharge Orders     None        Note:  This document was prepared using Dragon voice recognition software and may include unintentional dictation errors.    Willy Eddy, MD 03/05/23 [phone]

## 2023-03-05 NOTE — ED Provider Triage Note (Signed)
Emergency Medicine Provider Triage Evaluation Note  Brandi Swanson , a 48 y.o. female  was evaluated in triage.  Pt complains of productive cough x 2 days. Was seen at her PCP who advised evaluation at the ED for PNA. No fever  Review of Systems  Positive:  Negative:   Physical Exam  BP (!) 196/122 Comment: pt states she took bp meds today  Pulse (!) 104   Temp 98.7 F (37.1 C)   Resp 18   Ht 5\' 5"  (1.651 m)   Wt 88.5 kg   SpO2 99%   BMI 32.47 kg/m  Gen:   Awake, no distress   Resp:  Normal effort ; abnormal lungs sound bilaterally MSK:   Moves extremities without difficulty  Other:    Medical Decision Making  Medically screening exam initiated at 12:15 PM.  Appropriate orders placed.  Brandi Swanson was informed that the remainder of the evaluation will be completed by another provider, this initial triage assessment does not replace that evaluation, and the importance of remaining in the ED until their evaluation is complete.    Romeo Apple, Jerrol Helmers A, PA-C 03/05/23 1217

## 2023-05-24 ENCOUNTER — Ambulatory Visit: Payer: 59 | Attending: Cardiology | Admitting: Cardiology

## 2023-05-24 ENCOUNTER — Encounter: Payer: Self-pay | Admitting: Cardiology

## 2023-05-24 VITALS — BP 112/70 | HR 85 | Ht 65.0 in | Wt 215.6 lb

## 2023-05-24 DIAGNOSIS — F17210 Nicotine dependence, cigarettes, uncomplicated: Secondary | ICD-10-CM

## 2023-05-24 DIAGNOSIS — I10 Essential (primary) hypertension: Secondary | ICD-10-CM | POA: Diagnosis not present

## 2023-05-24 DIAGNOSIS — R072 Precordial pain: Secondary | ICD-10-CM

## 2023-05-24 MED ORDER — METOPROLOL TARTRATE 100 MG PO TABS
ORAL_TABLET | ORAL | 0 refills | Status: AC
Start: 2023-05-24 — End: ?

## 2023-05-24 NOTE — Patient Instructions (Signed)
 Medication Instructions:   Your Physician recommend you continue on your current medication as directed.     -Take one time dose of Metoprolol 100 mg 2 hours prior to coronary CTA procedure.   *If you need a refill on your cardiac medications before your next appointment, please call your pharmacy*  Lab Work:  Your provider would like for you to have following labs drawn today BMP.  If you have labs (blood work) drawn today and your tests are completely normal,  you will receive your results only by: MyChart Message (if you have MyChart) OR A paper copy in the mail If you have any lab test that is abnormal or we need to change your treatment, we will call you to review the results.  Testing/Procedures:   Your cardiac CT will be scheduled at one of the below locations:   [facility] [address] [place], [place] [address] [phone]  OR   [facility] [address] [place], [place] [address] [phone]   If scheduled at [facility] or [facility], please arrive 15 mins early for check-in and test prep.  There is spacious parking and easy access to the radiology department from the [facility] entrance. Please enter here and check-in with the desk attendant.   Please follow these instructions carefully (unless otherwise directed):  An IV will be required for this test and Nitroglycerin will be given.   On the Night Before the Test: Be sure to Drink plenty of water. Do not consume any caffeinated/decaffeinated beverages or chocolate 12 hours prior to your test. Do not take any antihistamines 12 hours prior to your test.  On the Day of the Test: Drink plenty of water until 1 hour prior to the test. Do not eat any food 1 hour prior to test. You may take your regular medications prior to the test.  Take metoprolol (Lopressor) two hours  prior to test. If you take Furosemide/Hydrochlorothiazide/Spironolactone/Chlorthalidone, please HOLD on the morning of the test. Patients who wear a continuous glucose monitor MUST remove the device prior to scanning. FEMALES- please wear underwire-free bra if available, avoid dresses & tight clothing       After the Test: Drink plenty of water. After receiving IV contrast, you may experience a mild flushed feeling. This is normal. On occasion, you may experience a mild rash up to 24 hours after the test. This is not dangerous. If this occurs, you can take Benadryl 25 mg, Zyrtec, Claritin, or Allegra and increase your fluid intake. (Patients taking Tikosyn should avoid Benadryl, and may take Zyrtec, Claritin, or Allegra) If you experience trouble breathing, this can be serious. If it is severe call 911 IMMEDIATELY. If it is mild, please call our office.  We will call to schedule your test 2-4 weeks out understanding that some insurance companies will need an authorization prior to the service being performed.   For more information and frequently asked questions, please visit our website : [email or web address]  For non-scheduling related questions, please contact the cardiac imaging nurse navigator should you have any questions/concerns: Cardiac Imaging Nurse Navigators Direct Office Dial: [phone]   For scheduling needs, including cancellations and rescheduling, please call [place], [phone].   Follow-Up: At [facility], you and your health needs are our priority.  As part of our continuing mission to provide you with exceptional heart care, our providers are all part of one team.  This team includes your primary Cardiologist (physician) and Advanced Practice Providers or APPs (Physician Assistants and Nurse Practitioners) who all work together to provide you with the care you need, when you need it.  Your next appointment:   6-8 week(s)  Provider:   You  may see Debbe Odea, MD or one of the following Advanced Practice Providers on your designated Care Team:   Nicolasa Ducking, NP Ames Dura, PA-C Eula Listen, PA-C Cadence Stockton, PA-C Charlsie Quest, NP Carlos Levering, NP    We recommend signing up for the patient portal called "MyChart".  Sign up information is provided on this After Visit Summary.  MyChart is used to connect with patients for Virtual Visits (Telemedicine).  Patients are able to view lab/test results, encounter notes, upcoming appointments, etc.  Non-urgent messages can be sent to your provider as well.   To learn more about what you can do with MyChart, go to [email or web address].

## 2023-05-24 NOTE — Progress Notes (Signed)
 Cardiology Office Note:    Date:  05/24/2023   ID:  Brandi Swanson, DOB 08-Feb-1976, MRN [id number]  PCP:  Hillery Aldo, MD   [facility] Providers Cardiologist:  Debbe Odea, MD     Referring MD: Hillery Aldo, MD   No chief complaint on file.   History of Present Illness:    Brandi Swanson is a 48 y.o. female with a hx of hypertension, current smoker x 20 years, COPD presenting for follow-up.  Seen in the hospital 4 months ago due to symptoms of chest pain.  Chest pain was atypical, reproducible with palpation consistent with musculoskeletal etiology.  Blood pressures were elevated.  BP meds were adjusted.  Her blood pressure is now adequately controlled.  She still has occasional chest discomfort, not related with exertion.  She still smokes, is working on quitting.  Currently denies any chest wall tenderness.  Echocardiogram 01/2023 reviewed by myself, EF 55-60%, impaired relaxation.  Past Medical History:  Diagnosis Date   Acute cholecystitis due to biliary calculus 03/15/2018   Hypertension    Migraine with aura 08/08/2015   Tarsal tunnel syndrome    Tarsal tunnel syndrome of left side 08/08/2015   Tobacco abuse 08/08/2015    Past Surgical History:  Procedure Laterality Date   CHOLECYSTECTOMY N/A 03/16/2018   Procedure: LAPAROSCOPIC CHOLECYSTECTOMY;  Surgeon: Ancil Linsey, MD;  Location: [facility] ORS;  Service: General;  Laterality: N/A;   FOOT SURGERY Left    X's Three   TUBAL LIGATION      Current Medications: Current Meds  Medication Sig   albuterol (VENTOLIN HFA) 108 (90 Base) MCG/ACT inhaler Inhale 2 puffs into the lungs every 6 (six) hours as needed for wheezing or shortness of breath.   amitriptyline (ELAVIL) 50 MG tablet Take 1 tablet (50 mg total) by mouth at bedtime.   aspirin-acetaminophen-caffeine (EXCEDRIN MIGRAINE) 250-250-65 MG tablet Take 1 tablet by mouth every 6 (six) hours as needed for headache.   celecoxib (CELEBREX) 100 MG  capsule Take 100 mg by mouth 2 (two) times daily.   cyclobenzaprine (FEXMID) 7.5 MG tablet Take 1 tablet (7.5 mg total) by mouth 3 (three) times daily as needed for muscle spasms (left chest rib pain).   fluticasone (FLONASE) 50 MCG/ACT nasal spray Place 2 sprays into both nostrils daily.   hydrALAZINE (APRESOLINE) 50 MG tablet Take 1 tablet (50 mg total) by mouth every 8 (eight) hours.   hydrochlorothiazide (HYDRODIURIL) 50 MG tablet Take 50 mg by mouth daily.   hydrOXYzine (ATARAX) 25 MG tablet Take 25 mg by mouth every 6 (six) hours as needed for anxiety or itching.   metoprolol tartrate (LOPRESSOR) 100 MG tablet TAKE 1 TABLET 2 HR PRIOR TO CARDIAC PROCEDURE   nicotine (NICODERM CQ - DOSED IN MG/24 HOURS) 21 mg/24hr patch Place 1 patch (21 mg total) onto the skin daily.   omeprazole (PRILOSEC) 40 MG capsule Take 40 mg by mouth daily.   ondansetron (ZOFRAN-ODT) 4 MG disintegrating tablet Take 1 tablet (4 mg total) by mouth every 8 (eight) hours as needed.   PARoxetine (PAXIL) 40 MG tablet Take 1 tablet (40 mg total) by mouth daily.   pregabalin (LYRICA) 25 MG capsule Take 25 mg by mouth daily.   propranolol (INDERAL) 80 MG tablet Take 80 mg by mouth 2 (two) times daily.   spironolactone (ALDACTONE) 50 MG tablet Take 50 mg by mouth daily.   SUMAtriptan (IMITREX) 50 MG tablet Take 50 mg by mouth as directed.  TAKE 1 TABLET BY MOUTH NOW, MAY REPEAT IN 2 HOURS. NO MORE THAN 4 TABLETS (200MG )PER DAY.   valsartan (DIOVAN) 160 MG tablet Take 1 tablet (160 mg total) by mouth daily.     Allergies:   Lisinopril and Ibuprofen   Social History   Socioeconomic History   Marital status: Divorced    Spouse name: Not on file   Number of children: Not on file   Years of education: Not on file   Highest education level: Not on file  Occupational History   Not on file  Tobacco Use   Smoking status: Former    Current packs/day: 0.00    Average packs/day: 0.5 packs/day for 20.0 years (10.0 ttl pk-yrs)     Types: Cigarettes    Start date: 03/19/1998    Quit date: 03/19/2018    Years since quitting: 5.1   Smokeless tobacco: Never  Substance and Sexual Activity   Alcohol use: No    Alcohol/week: 0.0 standard drinks of alcohol   Drug use: Yes    Types: Marijuana   Sexual activity: Not on file  Other Topics Concern   Not on file  Social History Narrative   Lives with her son.   Walks 10+ miles to get to work   People can sometimes help give rides   -- food and bills are a huge concern to her       Patient signed inform consent to release information for possible refferals    Social Drivers of Health   Financial Resource Strain: High Risk (08/12/2017)   Overall Financial Resource Strain (CARDIA)    Difficulty of Paying Living Expenses: Very hard  Food Insecurity: Food Insecurity Present (01/23/2023)   Hunger Vital Sign    Worried About Running Out of Food in the Last Year: Often true    Ran Out of Food in the Last Year: Often true  Transportation Needs: No Transportation Needs (01/23/2023)   PRAPARE - Administrator, Civil Service (Medical): No    Lack of Transportation (Non-Medical): No  Physical Activity: Sufficiently Active (08/12/2017)   Exercise Vital Sign    Days of Exercise per Week: 4 days    Minutes of Exercise per Session: 50 min  Stress: Stress Concern Present (08/12/2017)   [employer] of Occupational Health - Occupational Stress Questionnaire    Feeling of Stress : Very much  Social Connections: Unknown (08/12/2017)   Social Connection and Isolation Panel [NHANES]    Frequency of Communication with Friends and Family: Three times a week    Frequency of Social Gatherings with Friends and Family: Three times a week    Attends Religious Services: 1 to 4 times per year    Active Member of Clubs or Organizations: No    Attends Banker Meetings: Never    Marital Status: Not on file     Family History: The patient's family history includes  COPD in her mother; Heart disease in her father; Heart failure in her mother; Hypertension in her brother, father, mother, and sister.  ROS:   Please see the history of present illness.     All other systems reviewed and are negative.  EKGs/Labs/Other Studies Reviewed:    The following studies were reviewed today:  EKG Interpretation Date/Time:  Monday May 24 2023 13:57:37 EDT Ventricular Rate:  85 PR Interval:  196 QRS Duration:  98 QT Interval:  408 QTC Calculation: 485 R Axis:   14  Text Interpretation: Normal  sinus rhythm Prolonged QT Confirmed by Debbe Odea ([id number]) on 05/24/2023 2:02:31 PM    Recent Labs: 01/22/2023: TSH 2.016 01/23/2023: Magnesium 2.2 03/05/2023: ALT 21; BUN 13; Creatinine, Ser 0.88; Hemoglobin 16.4; Platelets 242; Potassium 3.7; Sodium 138  Recent Lipid Panel    Component Value Date/Time   CHOL 150 08/12/2017 1106   TRIG 130 08/12/2017 1106   HDL 41 08/12/2017 1106   CHOLHDL 3.7 08/12/2017 1106   LDLCALC 83 08/12/2017 1106     Risk Assessment/Calculations:             Physical Exam:    VS:  BP 112/70   Pulse 85   Ht 5\' 5"  (1.651 m)   Wt 215 lb 9.6 oz (97.8 kg)   LMP 05/11/2023 (Approximate)   SpO2 97%   BMI 35.88 kg/m     Wt Readings from Last 3 Encounters:  05/24/23 215 lb 9.6 oz (97.8 kg)  03/05/23 195 lb 1.7 oz (88.5 kg)  01/25/23 195 lb 1.7 oz (88.5 kg)     GEN:  Well nourished, well developed in no acute distress HEENT: Normal NECK: No JVD; No carotid bruits CARDIAC: RRR, no murmurs, rubs, gallops RESPIRATORY:  Clear to auscultation without rales, wheezing or rhonchi  ABDOMEN: Soft, non-tender, non-distended MUSCULOSKELETAL:  No edema; No deformity  SKIN: Warm and dry NEUROLOGIC:  Alert and oriented x 3 PSYCHIATRIC:  Normal affect   ASSESSMENT:    1. Precordial pain   2. Primary hypertension   3. Smoking 1/2 pack a day or less    PLAN:    In order of problems listed above:  Chest pain, several risk  factors.  Echo 12/24 with normal EF 55 to 60%.  Obtain coronary CTA to rule out CAD. Hypertension, BP controlled.  Continue current BP regimen including Norvasc, valsartan, HCTZ, spironolactone, hydralazine.  Low-salt diet, smoking cessation recommended. Patient is a current smoker, cessation advised.  Follow-up after coronary CT     Medication Adjustments/Labs and Tests Ordered: Current medicines are reviewed at length with the patient today.  Concerns regarding medicines are outlined above.  Orders Placed This Encounter  Procedures   CT CORONARY MORPH W/CTA COR W/SCORE W/CA W/CM &/OR WO/CM   Basic metabolic panel with GFR   EKG 60-AVWU   Meds ordered this encounter  Medications   metoprolol tartrate (LOPRESSOR) 100 MG tablet    Sig: TAKE 1 TABLET 2 HR PRIOR TO CARDIAC PROCEDURE    Dispense:  1 tablet    Refill:  0    Patient Instructions  Medication Instructions:   Your Physician recommend you continue on your current medication as directed.     -Take one time dose of Metoprolol 100 mg 2 hours prior to coronary CTA procedure.   *If you need a refill on your cardiac medications before your next appointment, please call your pharmacy*  Lab Work:  Your provider would like for you to have following labs drawn today BMP.  If you have labs (blood work) drawn today and your tests are completely normal,  you will receive your results only by: MyChart Message (if you have MyChart) OR A paper copy in the mail If you have any lab test that is abnormal or we need to change your treatment, we will call you to review the results.  Testing/Procedures:   Your cardiac CT will be scheduled at one of the below locations:   [facility] [address] [place], [place] [address] [phone]  OR   [facility] [address] [place], [place] [address] [phone]   If scheduled at [facility] or [facility], please arrive 15 mins early for check-in and test prep.  There is spacious parking and easy access to the radiology department from the [facility] entrance. Please enter here and check-in with the desk attendant.   Please follow these instructions carefully (unless otherwise directed):  An IV will be required for this test and Nitroglycerin will be given.   On the Night Before the Test: Be sure to Drink plenty of water. Do not consume any caffeinated/decaffeinated beverages or chocolate 12 hours prior to your test. Do not take any antihistamines 12 hours prior to your test.  On the Day of the Test: Drink plenty of water until 1 hour prior to the test. Do not eat any food 1 hour prior to test. You may take your regular medications prior to the test.  Take metoprolol (Lopressor) two hours prior to test. If you take Furosemide/Hydrochlorothiazide/Spironolactone/Chlorthalidone, please HOLD on the morning of the test. Patients who wear a continuous glucose monitor MUST remove the device prior to scanning. FEMALES- please wear underwire-free bra if available, avoid dresses & tight clothing       After the Test: Drink plenty of water. After receiving IV contrast, you may experience a mild flushed feeling. This is normal. On occasion, you may experience a mild rash up to 24 hours after the test. This is not dangerous. If this occurs, you can take Benadryl 25 mg, Zyrtec, Claritin, or Allegra and increase your fluid intake. (Patients taking Tikosyn should avoid Benadryl, and may take Zyrtec, Claritin, or Allegra) If you experience trouble breathing, this can be serious. If it is severe call 911 IMMEDIATELY. If it is mild, please call our office.  We will call to schedule your test 2-4 weeks out understanding that some insurance companies will need an authorization prior to the service being performed.   For more information and  frequently asked questions, please visit our website : [email or web address]  For non-scheduling related questions, please contact the cardiac imaging nurse navigator should you have any questions/concerns: Cardiac Imaging Nurse Navigators Direct Office Dial: [phone]   For scheduling needs, including cancellations and rescheduling, please call [place], [phone].   Follow-Up: At [facility], you and your health needs are our priority.  As part of our continuing mission to provide you with exceptional heart care, our providers are all part of one team.  This team includes your primary Cardiologist (physician) and Advanced Practice Providers or APPs (Physician Assistants and Nurse Practitioners) who all work together to provide you with the care you need, when you need it.  Your next appointment:   6-8 week(s)  Provider:   You may see Debbe Odea, MD or one of the following Advanced Practice Providers on your designated Care Team:   Nicolasa Ducking, NP Ames Dura, PA-C Eula Listen, PA-C Cadence Hatfield, PA-C Charlsie Quest, NP Carlos Levering, NP    We recommend signing up for the patient portal called "MyChart".  Sign up information is provided on this After Visit Summary.  MyChart is used to connect with patients for Virtual Visits (Telemedicine).  Patients are able to view lab/test results, encounter notes, upcoming appointments, etc.  Non-urgent messages can be sent to your provider as well.   To learn more about what you can do with MyChart, go to [email or web address].  Signed, Debbe Odea, MD  05/24/2023 3:16 PM    [facility]

## 2023-05-25 LAB — BASIC METABOLIC PANEL WITH GFR
BUN/Creatinine Ratio: 20 (ref 9–23)
BUN: 18 mg/dL (ref 6–24)
CO2: 18 mmol/L — ABNORMAL LOW (ref 20–29)
Calcium: 9.1 mg/dL (ref 8.7–10.2)
Chloride: 108 mmol/L — ABNORMAL HIGH (ref 96–106)
Creatinine, Ser: 0.88 mg/dL (ref 0.57–1.00)
Glucose: 77 mg/dL (ref 70–99)
Potassium: 4.3 mmol/L (ref 3.5–5.2)
Sodium: 142 mmol/L (ref 134–144)
eGFR: 80 mL/min/{1.73_m2} (ref >59)

## 2023-06-10 ENCOUNTER — Ambulatory Visit: Admission: RE | Admit: 2023-06-10 | Source: Ambulatory Visit

## 2023-06-17 ENCOUNTER — Ambulatory Visit: Admission: RE | Admit: 2023-06-17 | Source: Ambulatory Visit

## 2023-06-28 NOTE — Addendum Note (Signed)
 Addended by: Hollace Lund on: 06/28/2023 02:40 PM   Modules accepted: Orders

## 2023-07-01 ENCOUNTER — Ambulatory Visit

## 2023-07-01 ENCOUNTER — Telehealth: Payer: Self-pay | Admitting: Cardiology

## 2023-07-01 NOTE — Telephone Encounter (Signed)
 Sent to [place] and [place] for insurance decision

## 2023-07-01 NOTE — Telephone Encounter (Signed)
 Patient called to follow-up on her canceled CT CARDIAC [facility] test.  Patient wants a call back to discuss next steps.

## 2023-07-05 ENCOUNTER — Ambulatory Visit: Admitting: Cardiology

## 2023-07-22 ENCOUNTER — Encounter

## 2023-07-30 NOTE — Progress Notes (Signed)
 No show

## 2023-08-02 ENCOUNTER — Ambulatory Visit: Payer: Self-pay | Admitting: Internal Medicine

## 2023-08-04 ENCOUNTER — Telehealth (HOSPITAL_COMMUNITY): Payer: Self-pay | Admitting: Emergency Medicine

## 2023-08-04 MED ORDER — METOPROLOL TARTRATE 100 MG PO TABS: ORAL_TABLET | ORAL | 0 refills | Status: AC

## 2023-08-04 NOTE — Telephone Encounter (Signed)
 Reaching out to patient to offer assistance regarding upcoming cardiac imaging study; pt verbalizes understanding of appt date/time, parking situation and where to check in, pre-test NPO status and medications ordered, and verified current allergies; name and call back number provided for further questions should they arise Rockwell Alexandria RN Navigator Cardiac Imaging [facility] [phone] office [phone] cell

## 2023-08-04 NOTE — Telephone Encounter (Signed)
 Attempted to call patient regarding upcoming cardiac CT appointment. Left message on voicemail with name and callback number Rockwell Alexandria RN Navigator Cardiac Imaging [facility] [phone] Office [phone] Cell

## 2023-08-05 ENCOUNTER — Ambulatory Visit
Admission: RE | Admit: 2023-08-05 | Discharge: 2023-08-05 | Disposition: A | Discharge status: Home / Self Care | Source: Ambulatory Visit | Attending: Cardiology | Admitting: Cardiology

## 2023-08-05 DIAGNOSIS — R072 Precordial pain: Secondary | ICD-10-CM | POA: Insufficient documentation

## 2023-08-05 LAB — POCT I-STAT CREATININE: Creatinine, Ser: 0.9 mg/dL (ref 0.44–1.00)

## 2023-08-05 MED ORDER — NITROGLYCERIN 0.4 MG SL SUBL
0.8000 mg | SUBLINGUAL_TABLET | Freq: Once | SUBLINGUAL | Status: AC
Start: 2023-08-05 — End: 2023-08-05
  Administered 2023-08-05: 0.8 mg via SUBLINGUAL

## 2023-08-05 MED ORDER — IOHEXOL 350 MG/ML SOLN
80.0000 mL | Freq: Once | INTRAVENOUS | Status: AC | PRN
  Administered 2023-08-05: 80 mL via INTRAVENOUS

## 2023-08-05 NOTE — Progress Notes (Signed)
 Patient tolerated CT well. Vital signs stable encourage to drink water throughout day.Reasons explained and verbalized understanding. Ambulated steady gait.

## 2023-08-06 ENCOUNTER — Other Ambulatory Visit

## 2023-08-15 ENCOUNTER — Ambulatory Visit: Payer: Self-pay | Admitting: Cardiology

## 2023-08-17 ENCOUNTER — Ambulatory Visit: Admitting: Cardiology

## 2023-08-24 ENCOUNTER — Encounter
Admission: RE | Admit: 2023-08-24 | Discharge: 2023-08-24 | Disposition: A | Discharge status: Home / Self Care | Source: Ambulatory Visit | Attending: Cardiology | Admitting: Cardiology

## 2023-08-24 DIAGNOSIS — I517 Cardiomegaly: Secondary | ICD-10-CM | POA: Insufficient documentation

## 2023-08-24 DIAGNOSIS — R072 Precordial pain: Secondary | ICD-10-CM | POA: Insufficient documentation

## 2023-08-24 LAB — NM MYOCAR MULTI W/SPECT W/WALL MOTION / EF
LV dias vol: 146 mL (ref 46–106)
LV sys vol: 63 mL (ref 3.8–5.2)
Nuc Stress EF: 57 %
Peak HR: 112 {beats}/min
Rest HR: 81 {beats}/min
Rest Nuclear Isotope Dose: 10.2 mCi
SDS: 6
SRS: 3
SSS: 8
ST Depression (mm): 0 mm
Stress Nuclear Isotope Dose: 30.1 mCi
TID: 1.08

## 2023-08-24 MED ORDER — TECHNETIUM TC 99M TETROFOSMIN IV KIT
10.2000 | PACK | Freq: Once | INTRAVENOUS | Status: AC | PRN
  Administered 2023-08-24: 10.2 via INTRAVENOUS

## 2023-08-24 MED ORDER — TECHNETIUM TC 99M TETROFOSMIN IV KIT
30.0000 | PACK | Freq: Once | INTRAVENOUS | Status: AC | PRN
  Administered 2023-08-24: 30.08 via INTRAVENOUS

## 2023-08-24 MED ORDER — REGADENOSON 0.4 MG/5ML IV SOLN
0.4000 mg | Freq: Once | INTRAVENOUS | Status: AC
  Administered 2023-08-24: 0.4 mg via INTRAVENOUS
  Filled 2023-08-24: qty 5

## 2023-08-25 ENCOUNTER — Ambulatory Visit: Payer: Self-pay | Admitting: Cardiology

## 2023-09-17 NOTE — Progress Notes (Signed)
 No show

## 2023-09-20 ENCOUNTER — Ambulatory Visit: Admitting: Internal Medicine
